# Patient Record
Sex: Male | Born: 1950
Health system: Southern US, Community
[De-identification: ages and names within clinical notes are randomized; demographics above are authoritative.]

## PROBLEM LIST (undated history)

## (undated) DIAGNOSIS — F419 Anxiety disorder, unspecified: Secondary | ICD-10-CM

## (undated) DIAGNOSIS — F329 Major depressive disorder, single episode, unspecified: Secondary | ICD-10-CM

## (undated) DIAGNOSIS — T7840XA Allergy, unspecified, initial encounter: Secondary | ICD-10-CM

## (undated) DIAGNOSIS — I10 Essential (primary) hypertension: Secondary | ICD-10-CM

## (undated) DIAGNOSIS — G20A1 Parkinson's disease without dyskinesia, without mention of fluctuations: Secondary | ICD-10-CM

## (undated) DIAGNOSIS — E785 Hyperlipidemia, unspecified: Secondary | ICD-10-CM

## (undated) DIAGNOSIS — G4730 Sleep apnea, unspecified: Secondary | ICD-10-CM

## (undated) DIAGNOSIS — R251 Tremor, unspecified: Secondary | ICD-10-CM

## (undated) DIAGNOSIS — G20 Parkinson's disease: Secondary | ICD-10-CM

## (undated) DIAGNOSIS — M109 Gout, unspecified: Secondary | ICD-10-CM

## (undated) DIAGNOSIS — K219 Gastro-esophageal reflux disease without esophagitis: Secondary | ICD-10-CM

## (undated) DIAGNOSIS — S0990XA Unspecified injury of head, initial encounter: Secondary | ICD-10-CM

## (undated) DIAGNOSIS — N529 Male erectile dysfunction, unspecified: Secondary | ICD-10-CM

## (undated) DIAGNOSIS — C801 Malignant (primary) neoplasm, unspecified: Secondary | ICD-10-CM

## (undated) DIAGNOSIS — S060X9A Concussion with loss of consciousness of unspecified duration, initial encounter: Secondary | ICD-10-CM

## (undated) DIAGNOSIS — M1990 Unspecified osteoarthritis, unspecified site: Secondary | ICD-10-CM

## (undated) DIAGNOSIS — F32A Depression, unspecified: Secondary | ICD-10-CM

## (undated) DIAGNOSIS — S060XAA Concussion with loss of consciousness status unknown, initial encounter: Secondary | ICD-10-CM

## (undated) DIAGNOSIS — R410 Disorientation, unspecified: Secondary | ICD-10-CM

## (undated) HISTORY — DX: Essential (primary) hypertension: I10

## (undated) HISTORY — PX: ESOPHAGOGASTRODUODENOSCOPY: SHX1529

## (undated) HISTORY — DX: Tremor, unspecified: R25.1

## (undated) HISTORY — DX: Major depressive disorder, single episode, unspecified: F32.9

## (undated) HISTORY — DX: Anxiety disorder, unspecified: F41.9

## (undated) HISTORY — PX: TOTAL HIP ARTHROPLASTY: SHX124

## (undated) HISTORY — DX: Depression, unspecified: F32.A

## (undated) HISTORY — DX: Hyperlipidemia, unspecified: E78.5

## (undated) HISTORY — PX: JOINT REPLACEMENT: SHX530

## (undated) HISTORY — PX: SQUAMOUS CELL CARCINOMA EXCISION: SHX2433

---

## 1995-06-07 HISTORY — PX: OTHER SURGICAL HISTORY: SHX169

## 2012-11-22 ENCOUNTER — Ambulatory Visit: Payer: Self-pay | Admitting: Neurology

## 2012-11-28 ENCOUNTER — Ambulatory Visit: Payer: Self-pay | Admitting: Neurology

## 2013-01-28 ENCOUNTER — Ambulatory Visit (INDEPENDENT_AMBULATORY_CARE_PROVIDER_SITE_OTHER): Payer: BC Managed Care – PPO | Admitting: Neurology

## 2013-01-28 ENCOUNTER — Encounter: Payer: Self-pay | Admitting: Neurology

## 2013-01-28 VITALS — BP 110/62 | HR 72 | Temp 98.0°F | Resp 16 | Wt 268.0 lb

## 2013-01-28 DIAGNOSIS — F4323 Adjustment disorder with mixed anxiety and depressed mood: Secondary | ICD-10-CM

## 2013-01-28 DIAGNOSIS — R259 Unspecified abnormal involuntary movements: Secondary | ICD-10-CM

## 2013-01-28 DIAGNOSIS — R251 Tremor, unspecified: Secondary | ICD-10-CM

## 2013-01-28 LAB — CBC WITH DIFFERENTIAL/PLATELET
Basophils Relative: 0.3 % (ref 0.0–3.0)
Eosinophils Absolute: 0.1 10*3/uL (ref 0.0–0.7)
HCT: 40.5 % (ref 39.0–52.0)
Hemoglobin: 13.8 g/dL (ref 13.0–17.0)
Lymphs Abs: 1.6 10*3/uL (ref 0.7–4.0)
MCHC: 33.9 g/dL (ref 30.0–36.0)
MCV: 91.2 fl (ref 78.0–100.0)
Monocytes Absolute: 0.6 10*3/uL (ref 0.1–1.0)
Neutro Abs: 4.3 10*3/uL (ref 1.4–7.7)
Neutrophils Relative %: 64.9 % (ref 43.0–77.0)
RBC: 4.44 Mil/uL (ref 4.22–5.81)

## 2013-01-28 LAB — COMPREHENSIVE METABOLIC PANEL
AST: 20 U/L (ref 0–37)
Alkaline Phosphatase: 60 U/L (ref 39–117)
BUN: 11 mg/dL (ref 6–23)
Creatinine, Ser: 0.9 mg/dL (ref 0.4–1.5)
Glucose, Bld: 79 mg/dL (ref 70–99)
Total Bilirubin: 0.6 mg/dL (ref 0.3–1.2)

## 2013-01-28 MED ORDER — BENZTROPINE MESYLATE 1 MG PO TABS: 1.0000 mg | ORAL_TABLET | Freq: Every day | ORAL | Status: DC

## 2013-01-28 NOTE — Progress Notes (Signed)
Christian Anderson was seen today in the movement disorders clinic for neurologic consultation at the request of Dr. Malvin Anderson.  His primary care physician is Christian D, MD.  The consultation is for the evaluation of tremor.  The patient is accompanied by his wife who supplements the history.  The pt reports that he developed tremor in the RUE in the mid 1990's that was action based.  It was only in the R hand.  It sounds like it was dx with writers cramp (but occurred when holding cup as well as writing and basically any task that required some degree of focus).  He was placed on nadolol which helped some, and later the nadolol was used for BP as well.  In 08/2010, he began to have tremor in the L hand at rest.  Shortly thereafter, he noted a similar tremor in the R leg.  He was placed on artane and it worked well but caused hallucinations and short term memory changes.  He was then placed on levodopa and it helped but not as much as the artane.  He was in [place] at the time and it was recommended that he see a movement specialist.  He was moving at the time and ended up moving to [facility] and saw Dr. Malvin Anderson.  He was started on klonopin at the end of July, 0.5 mg, and he takes one tablet bid.  He has not noted much difference with the med.  Just very recently he was started on gabapentin.  He does not think that it has been helpful.  He does think that some of the medications cause daytime hypersomnolence.  He also has sleep apnea and just a few weeks ago was placed on CPAP.  The patient's wife does report that stress significantly affects the tremor.  She reports that the patient has been under a significant amount of stress over the last few years.  He has had some trouble at work, lived apart from his family because of work and is just now starting to get some of these issues resolved.  As a result of these issues, she reports that depression became an issue, which is better with medication.  He is not  suicidal or homicidal.  He does not think that caffeine or alcohol affects the tremor (alcohol may slow it down but he is not sure).     Specific Symptoms:  Tremor: yes and goes away at night Voice: ?  Sleep:   Vivid Dreams:  no  Acting out dreams:  no Wet Pillows: no Postural symptoms:  yes  Falls?  no Bradykinesia symptoms: slow getting out of chair b/c b/l hip replacement Loss of smell:  yes x 15 years Loss of taste:  no Urinary Incontinence:  no Difficulty Swallowing:  no Handwriting, micrographia: no Trouble with ADL's:  no  Trouble buttoning clothing: no Depression:  yes (sx's x few years - on antidepressant that helps) Memory changes:  no Hallucinations:  yes (with artane but one other time when not on med, saw Christian Anderson)  visual distortions: no N/V:  no Lightheaded:  yes (dizzy and off balance, no spinning)  Syncope: no Diplopia:  no Dyskinesia:  no   I had the opportunity to review Dr. Geralyn Anderson notes.    When the patient went to Dr. Malvin Anderson, he did not think that the patient had Parkinson's disease and discontinued his Sinemet in May 2014.  The patient's symptoms got worse.  Requip was initiated on 10/18/2012.  This  did not seem to help.  He has been on Artane in the past with significant benefit with the tremor, but it caused hallucinations.  He most recently started on clonazepam.  Neuroimaging has previously been performed.  It is not available for my review today.  The patient reports that he had a normal CT of the brain.  He has refused MRI because of severe claustrophobia.  In the past, a DaT scan was recommended that he also refused because of claustrophobia.  PREVIOUS MEDICATIONS: Sinemet, Requip and artane and klonopin  ALLERGIES:  No Known Allergies  CURRENT MEDICATIONS:    Medication List       This list is accurate as of: 01/28/13  9:52 AM.  Always use your most recent med list.               aspirin 325 MG tablet  Take 325 mg by mouth daily.      bumetanide 0.5 MG tablet  Commonly known as:  BUMEX  daily.     clonazePAM 0.5 MG tablet  Commonly known as:  KLONOPIN  2 (two) times daily as needed.     cyclobenzaprine 10 MG tablet  Commonly known as:  FLEXERIL     gabapentin 300 MG capsule  Commonly known as:  NEURONTIN  300 mg 3 (three) times daily.     lisinopril 20 MG tablet  Commonly known as:  PRINIVIL,ZESTRIL  Take 20 mg by mouth daily.     nadolol 80 MG tablet  Commonly known as:  CORGARD  1 1/2 bid     rOPINIRole 0.5 MG tablet  Commonly known as:  REQUIP  3 (three) times daily.     simvastatin 40 MG tablet  Commonly known as:  ZOCOR  at bedtime.          PAST MEDICAL HISTORY:   Past Medical History  Diagnosis Date  . Tremor   . Hypertension   . Hyperlipemia     PAST SURGICAL HISTORY:   Past Surgical History  Procedure Laterality Date  . Total hip arthroplasty      bilateral  . Veins stripping    . Mole removal      SOCIAL HISTORY:   History   Social History  . Marital Status: Married    Spouse Name: N/A    Number of Children: N/A  . Years of Education: N/A   Occupational History  . Not on file.   Social History Main Topics  . Smoking status: Former Smoker    Start date: 01/29/1988  . Smokeless tobacco: Never Used     Comment: occasional cigar not had any in 3-4 years.  . Alcohol Use: Yes     Comment: 2-3 beers nightly, occasional wine.  . Drug Use: Not on file  . Sexual Activity: Not on file   Other Topics Concern  . Not on file   Social History Narrative  . No narrative on file    FAMILY HISTORY:   Family Status  Relation Status Death Age  . Father Deceased     60  . Mother Deceased     56  . Sister Alive   . Sister Alive   . Sister Alive     ROS:  A complete 10 system review of systems was obtained and was unremarkable apart from what is mentioned above.  PHYSICAL EXAMINATION:    VITALS:   Filed Vitals:   01/28/13 0903  BP: 110/62  Pulse: 72   Temp:  98 F (36.7 C)  Resp: 16  Weight: 268 lb (121.564 kg)    GEN:  The patient appears stated age and is in NAD. HEENT:  Normocephalic, atraumatic.  The mucous membranes are moist. The superficial temporal arteries are without ropiness or tenderness. CV:  RRR Lungs:  CTAB Neck/HEME:  There are no carotid bruits bilaterally.  Neurological examination:  Orientation: The patient is alert and oriented x3. Fund of knowledge is appropriate.  Recent and remote memory are intact.  Attention and concentration are normal.    Able to name objects and repeat phrases. Cranial nerves: There is good facial symmetry. No hypomimia.  Pupils are equal round and reactive to light bilaterally. Fundoscopic exam reveals clear margins bilaterally. Extraocular muscles are intact. The visual fields are full to confrontational testing. The speech is fluent and clear. Soft palate rises symmetrically and there is no tongue deviation. Hearing is intact to conversational tone. Sensation: Sensation is intact to light and pinprick throughout (facial, trunk, extremities). Vibration is intact at the bilateral big toe. There is no extinction with double simultaneous stimulation. There is no sensory dermatomal level identified. Motor: Strength is 5/5 in the bilateral upper and lower extremities.   Shoulder shrug is equal and symmetric.  There is no pronator drift. Deep tendon reflexes: Deep tendon reflexes are 2/4 at the bilateral biceps, triceps, brachioradialis, patella and achilles. Plantar responses are downgoing bilaterally.  Movement examination: Tone: There is normal tone in the bilateral upper extremities.  The tone in the lower extremities is normal.  Abnormal movements: There is an intermittent, severe, flapping tremor of the left upper extremity.  When asked to count backward, the tremor diminishes but does not go away.  When asked to name the months in reverse order, the tremor diminishes but does not go away.   When he holds his hand up near his shoulder (had L hand on right shoulder while talking with me for a while) the tremor is gone.  There is no tremor antigravity, even when asked to hold the hand in the same posture.  There is no tremor with intention.  There is intermittent right lower extremity tremor. Coordination:  There is no decremation with RAM's, including with all forms of rapid alternating movements, including hand opening and closing, finger taps, alternating supination/pronation of the forearm, heel taps and toe taps. Gait and Station: The patient has no difficulty arising out of a deep-seated chair without the use of the hands. The patient's stride length is normal.  The left upper extremity tremor continues with ambulation.  ASSESSMENT/PLAN:  1.  Tremor.  -I had discussion with the patient and his wife.  I see no parkinsonian features.  He is not bradykinetic, does not have a shuffling gait and has no rigidity.  He has a significant high amplitude tremor that is somewhat variable during the examination.  I agree that psychogenic etiology is in the differential diagnoses.  Asymmetric essential tremor cannot be ruled out, but I think that is much lower on the differential, as he had nothing with intention or antigravity.  In addition, when Dr. Geralyn Anderson notes are reviewed, the course has been pretty rapid and changing.  I agree with Dr. Malvin Anderson that it would be nice to see an MRI of the brain.  The patient is very reluctant.  He cannot do an open MRI even with sedation.  I talked to him about the MRI at [facility] (closed but pts can sit up and watch TV) and he  will consider this.  He is not willing to undergo DaT scan.  -We decided to try benztropine at bedtime.  The dosage may need to be increased if it is helpful, but I did not want to cause hallucinations as he had with the trihexyphenidyl.  I did warn him that this could be a side effect.  We talked about the other potential  anticholinergic side effects, amongst others.  He understood.  Risks, benefits, side effects and alternative therapies were discussed.  The opportunity to ask questions was given and they were answered to the best of my ability.  The patient expressed understanding and willingness to follow the outlined treatment protocols.  -I talked to him about the importance of getting his stress and her better control.  Even if stress is not the primary etiology of the tremor, he and his wife admit that he has been under a significant amount of anxiety and stress and we talked about stress reduction techniques.  His wife would like him to try acupuncture, which I have no objection to.  If these things are not helpful, then perhaps sending him to a psychiatrist would be of value.  -The pt is going to continue to follow with Dr. Malvin Anderson and they already have an appointment scheduled in the future.  I would be happy to see him in the future if needed.  Thank you, Ian Malkin, for allowing me to participate in the care of your patient.  Please feel free to contact me if you have any questions or concerns.

## 2013-01-28 NOTE — Patient Instructions (Addendum)
1.  Exercise 2.  Try cogentin (benztropine) - 1 mg - 1 tablet at bedtime 3.  I will send Dr. Malvin Johns my notes and recommendations

## 2013-01-29 LAB — TSH: TSH: 0.99 u[IU]/mL (ref 0.35–5.50)

## 2013-01-29 LAB — FOLATE: Folate: 5.7 ng/mL — ABNORMAL LOW (ref >5.9)

## 2013-01-29 LAB — VITAMIN B12: Vitamin B-12: 307 pg/mL (ref 211–911)

## 2013-04-30 ENCOUNTER — Observation Stay: Payer: Self-pay | Admitting: Internal Medicine

## 2013-04-30 LAB — CBC WITH DIFFERENTIAL/PLATELET
Basophil %: 0.3 %
Eosinophil #: 0.1 10*3/uL (ref 0.0–0.7)
HGB: 14.1 g/dL (ref 13.0–18.0)
MCH: 30.9 pg (ref 26.0–34.0)
MCV: 91 fL (ref 80–100)
RBC: 4.56 10*6/uL (ref 4.40–5.90)
RDW: 13.1 % (ref 11.5–14.5)

## 2013-04-30 LAB — COMPREHENSIVE METABOLIC PANEL
Alkaline Phosphatase: 81 U/L
Anion Gap: 6 — ABNORMAL LOW (ref 7–16)
Bilirubin,Total: 0.5 mg/dL (ref 0.2–1.0)
Chloride: 97 mmol/L — ABNORMAL LOW (ref 98–107)
EGFR (Non-African Amer.): >60
Glucose: 132 mg/dL — ABNORMAL HIGH (ref 65–99)
Osmolality: 258 (ref 275–301)
Total Protein: 7.2 g/dL (ref 6.4–8.2)

## 2013-04-30 LAB — LIPASE, BLOOD: Lipase: 116 U/L (ref 73–393)

## 2013-05-01 LAB — URINALYSIS, COMPLETE
Bacteria: NONE SEEN
Bilirubin,UR: NEGATIVE
Glucose,UR: NEGATIVE mg/dL (ref 0–75)
Leukocyte Esterase: NEGATIVE
Protein: NEGATIVE
RBC,UR: 1 /HPF (ref 0–5)
Specific Gravity: 1.013 (ref 1.003–1.030)
WBC UR: <1 /HPF (ref 0–5)

## 2013-05-01 LAB — LIPID PANEL
Triglycerides: 102 mg/dL (ref 0–200)
VLDL Cholesterol, Calc: 20 mg/dL (ref 5–40)

## 2013-05-01 LAB — SODIUM: Sodium: 132 mmol/L — ABNORMAL LOW (ref 136–145)

## 2013-06-24 ENCOUNTER — Ambulatory Visit: Payer: Self-pay | Admitting: Internal Medicine

## 2013-07-02 ENCOUNTER — Telehealth: Payer: Self-pay | Admitting: *Deleted

## 2013-07-02 ENCOUNTER — Telehealth: Payer: Self-pay | Admitting: Neurology

## 2013-07-02 NOTE — Telephone Encounter (Signed)
Dr. Melrose Nakayama is his neurologist then and will need to come from him.

## 2013-07-02 NOTE — Telephone Encounter (Signed)
Pt's spouse called requesting a refill for benztropine.

## 2013-07-02 NOTE — Telephone Encounter (Signed)
Requesting a refill for benztropine  You prescribed medication 8/14 #30 with 3 refills  Patient has no follow up and is being followed by Dr.Potter .Please advise

## 2013-07-02 NOTE — Telephone Encounter (Signed)
Patient requested a refill on Benztropine Dr Melrose Nakayama is the physician who is prescribing this medication for him patient states he will call Dr Melrose Nakayama for this refill.

## 2013-07-04 ENCOUNTER — Ambulatory Visit: Payer: Self-pay | Admitting: Gastroenterology

## 2013-07-04 HISTORY — PX: COLONOSCOPY: SHX174

## 2013-07-06 LAB — PATHOLOGY REPORT

## 2013-09-13 DIAGNOSIS — G479 Sleep disorder, unspecified: Secondary | ICD-10-CM | POA: Insufficient documentation

## 2013-09-13 DIAGNOSIS — F419 Anxiety disorder, unspecified: Secondary | ICD-10-CM | POA: Insufficient documentation

## 2013-09-13 DIAGNOSIS — F329 Major depressive disorder, single episode, unspecified: Secondary | ICD-10-CM | POA: Insufficient documentation

## 2013-09-13 DIAGNOSIS — F32A Depression, unspecified: Secondary | ICD-10-CM | POA: Insufficient documentation

## 2013-10-07 ENCOUNTER — Inpatient Hospital Stay: Payer: Self-pay | Admitting: Internal Medicine

## 2013-10-07 LAB — URINALYSIS, COMPLETE
BACTERIA: NONE SEEN
BILIRUBIN, UR: NEGATIVE
BLOOD: NEGATIVE
Glucose,UR: NEGATIVE mg/dL (ref 0–75)
KETONE: NEGATIVE
LEUKOCYTE ESTERASE: NEGATIVE
Nitrite: NEGATIVE
PH: 7 (ref 4.5–8.0)
Protein: NEGATIVE
RBC, UR: NONE SEEN /HPF (ref 0–5)
SPECIFIC GRAVITY: 1.012 (ref 1.003–1.030)
WBC UR: NONE SEEN /HPF (ref 0–5)

## 2013-10-07 LAB — COMPREHENSIVE METABOLIC PANEL
AST: 20 U/L (ref 15–37)
Albumin: 3.2 g/dL — ABNORMAL LOW (ref 3.4–5.0)
Alkaline Phosphatase: 82 U/L
Anion Gap: 3 — ABNORMAL LOW (ref 7–16)
BUN: 11 mg/dL (ref 7–18)
Bilirubin,Total: 0.5 mg/dL (ref 0.2–1.0)
CHLORIDE: 103 mmol/L (ref 98–107)
Calcium, Total: 9.1 mg/dL (ref 8.5–10.1)
Co2: 30 mmol/L (ref 21–32)
Creatinine: 0.93 mg/dL (ref 0.60–1.30)
EGFR (African American): >60
Glucose: 132 mg/dL — ABNORMAL HIGH (ref 65–99)
Osmolality: 273 (ref 275–301)
POTASSIUM: 4.6 mmol/L (ref 3.5–5.1)
SGPT (ALT): 28 U/L (ref 12–78)
Sodium: 136 mmol/L (ref 136–145)
TOTAL PROTEIN: 6.8 g/dL (ref 6.4–8.2)

## 2013-10-07 LAB — DRUG SCREEN, URINE

## 2013-10-07 LAB — CBC
HCT: 40.3 % (ref 40.0–52.0)
HGB: 13.5 g/dL (ref 13.0–18.0)
MCH: 30.9 pg (ref 26.0–34.0)
MCHC: 33.6 g/dL (ref 32.0–36.0)
MCV: 92 fL (ref 80–100)
Platelet: 201 10*3/uL (ref 150–440)
RBC: 4.38 10*6/uL — ABNORMAL LOW (ref 4.40–5.90)
RDW: 13.4 % (ref 11.5–14.5)
WBC: 6 10*3/uL (ref 3.8–10.6)

## 2013-10-07 LAB — TROPONIN I

## 2013-10-08 ENCOUNTER — Ambulatory Visit: Payer: Self-pay | Admitting: Neurology

## 2013-10-08 LAB — TSH: THYROID STIMULATING HORM: 0.987 u[IU]/mL

## 2013-10-09 LAB — COMPREHENSIVE METABOLIC PANEL
Albumin: 3.4 g/dL (ref 3.4–5.0)
Alkaline Phosphatase: 81 U/L
Anion Gap: 3 — ABNORMAL LOW (ref 7–16)
BUN: 10 mg/dL (ref 7–18)
Bilirubin,Total: 0.6 mg/dL (ref 0.2–1.0)
CHLORIDE: 107 mmol/L (ref 98–107)
CREATININE: 0.9 mg/dL (ref 0.60–1.30)
Calcium, Total: 9 mg/dL (ref 8.5–10.1)
Co2: 30 mmol/L (ref 21–32)
EGFR (African American): >60
EGFR (Non-African Amer.): >60
Glucose: 94 mg/dL (ref 65–99)
Osmolality: 278 (ref 275–301)
POTASSIUM: 3.9 mmol/L (ref 3.5–5.1)
SGOT(AST): 27 U/L (ref 15–37)
SGPT (ALT): 27 U/L (ref 12–78)
SODIUM: 140 mmol/L (ref 136–145)
Total Protein: 7.3 g/dL (ref 6.4–8.2)

## 2013-10-09 LAB — CBC WITH DIFFERENTIAL/PLATELET
BASOS ABS: 0 10*3/uL (ref 0.0–0.1)
Basophil %: 0.2 %
Eosinophil #: 0.1 10*3/uL (ref 0.0–0.7)
Eosinophil %: 1.1 %
HCT: 43.3 % (ref 40.0–52.0)
HGB: 14.5 g/dL (ref 13.0–18.0)
LYMPHS ABS: 1.5 10*3/uL (ref 1.0–3.6)
LYMPHS PCT: 24.8 %
MCH: 30.9 pg (ref 26.0–34.0)
MCHC: 33.6 g/dL (ref 32.0–36.0)
MCV: 92 fL (ref 80–100)
MONO ABS: 0.6 x10 3/mm (ref 0.2–1.0)
Monocyte %: 9.6 %
NEUTROS PCT: 64.3 %
Neutrophil #: 3.8 10*3/uL (ref 1.4–6.5)
Platelet: 203 10*3/uL (ref 150–440)
RBC: 4.71 10*6/uL (ref 4.40–5.90)
RDW: 13.1 % (ref 11.5–14.5)
WBC: 6 10*3/uL (ref 3.8–10.6)

## 2013-10-11 LAB — CSF CELL CT + PROT + GLU PANEL
CSF TUBE #: 2
Eosinophil: 0 %
Glucose, CSF: 60 mg/dL (ref 40–75)
LYMPHS PCT: 0 %
Monocytes/Macrophages: 0 %
NEUTROS PCT: 0 %
OTHER CELLS: 0 %
Protein, CSF: 49 mg/dL — ABNORMAL HIGH (ref 15–45)
RBC (CSF): 114 /mm3
WBC (CSF): 0 /mm3

## 2013-10-11 LAB — PROTIME-INR
INR: 1.1
Prothrombin Time: 13.9 secs (ref 11.5–14.7)

## 2013-10-11 LAB — APTT: ACTIVATED PTT: 29.7 s (ref 23.6–35.9)

## 2013-10-12 LAB — CBC WITH DIFFERENTIAL/PLATELET
Basophil #: 0 10*3/uL (ref 0.0–0.1)
Basophil %: 0.2 %
EOS PCT: 1.3 %
Eosinophil #: 0.1 10*3/uL (ref 0.0–0.7)
HCT: 48.3 % (ref 40.0–52.0)
HGB: 15.4 g/dL (ref 13.0–18.0)
Lymphocyte #: 1.7 10*3/uL (ref 1.0–3.6)
Lymphocyte %: 22 %
MCH: 29.5 pg (ref 26.0–34.0)
MCHC: 31.9 g/dL — AB (ref 32.0–36.0)
MCV: 93 fL (ref 80–100)
MONO ABS: 0.8 x10 3/mm (ref 0.2–1.0)
MONOS PCT: 10.3 %
NEUTROS PCT: 66.2 %
Neutrophil #: 5.1 10*3/uL (ref 1.4–6.5)
Platelet: 243 10*3/uL (ref 150–440)
RBC: 5.22 10*6/uL (ref 4.40–5.90)
RDW: 13.2 % (ref 11.5–14.5)
WBC: 7.8 10*3/uL (ref 3.8–10.6)

## 2013-10-12 LAB — COMPREHENSIVE METABOLIC PANEL
ALBUMIN: 3.3 g/dL — AB (ref 3.4–5.0)
AST: 33 U/L (ref 15–37)
Alkaline Phosphatase: 83 U/L
Anion Gap: 7 (ref 7–16)
BUN: 15 mg/dL (ref 7–18)
Bilirubin,Total: 0.7 mg/dL (ref 0.2–1.0)
Calcium, Total: 8.9 mg/dL (ref 8.5–10.1)
Chloride: 106 mmol/L (ref 98–107)
Co2: 27 mmol/L (ref 21–32)
Creatinine: 1.03 mg/dL (ref 0.60–1.30)
EGFR (African American): >60
EGFR (Non-African Amer.): >60
GLUCOSE: 100 mg/dL — AB (ref 65–99)
OSMOLALITY: 280 (ref 275–301)
Potassium: 3.9 mmol/L (ref 3.5–5.1)
SGPT (ALT): 33 U/L (ref 12–78)
Sodium: 140 mmol/L (ref 136–145)
Total Protein: 7.4 g/dL (ref 6.4–8.2)

## 2013-10-12 LAB — AMMONIA: Ammonia, Plasma: <10 mcmol/L (ref 11–32)

## 2013-10-17 DIAGNOSIS — F4489 Other dissociative and conversion disorders: Secondary | ICD-10-CM | POA: Insufficient documentation

## 2013-10-17 DIAGNOSIS — F449 Dissociative and conversion disorder, unspecified: Secondary | ICD-10-CM | POA: Insufficient documentation

## 2013-12-23 DIAGNOSIS — R609 Edema, unspecified: Secondary | ICD-10-CM | POA: Insufficient documentation

## 2013-12-23 DIAGNOSIS — I10 Essential (primary) hypertension: Secondary | ICD-10-CM | POA: Insufficient documentation

## 2014-01-21 DIAGNOSIS — G20 Parkinson's disease: Secondary | ICD-10-CM | POA: Insufficient documentation

## 2014-01-21 DIAGNOSIS — E669 Obesity, unspecified: Secondary | ICD-10-CM | POA: Insufficient documentation

## 2014-01-21 DIAGNOSIS — G20A1 Parkinson's disease without dyskinesia, without mention of fluctuations: Secondary | ICD-10-CM | POA: Insufficient documentation

## 2014-02-06 DIAGNOSIS — Z9660 Presence of unspecified orthopedic joint implant: Secondary | ICD-10-CM | POA: Insufficient documentation

## 2014-02-06 DIAGNOSIS — Z96649 Presence of unspecified artificial hip joint: Secondary | ICD-10-CM | POA: Insufficient documentation

## 2014-02-06 DIAGNOSIS — M25559 Pain in unspecified hip: Secondary | ICD-10-CM | POA: Insufficient documentation

## 2014-06-11 DIAGNOSIS — E7800 Pure hypercholesterolemia, unspecified: Secondary | ICD-10-CM | POA: Insufficient documentation

## 2014-09-26 NOTE — H&P (Signed)
ADDENDUM  PATIENT NAME:  SAVOY, SOMERVILLE MR#:  [id number] DATE OF BIRTH:  1951/04/03  DATE OF ADMISSION:  04/30/2013  HOME MEDICATIONS: Include Cialis taken as needed, aspirin 325 mg p.o. daily, lisinopril 20 mg p.o. b.i.d., gabapentin 300 mg p.o. t.i.d., citalopram 20 mg p.o. daily, Benztropine 1 mg p.o. at bedtime, Ropinirole 0.5 mg p.o. 3 times daily, nadolol 80 mg 1-1/2 tablets p.o. b.i.d., Bumex 0.5 mg tablet p.o. daily and Flexeril 10 mg p.o. at bedtime.   ____________________________ Aaron Mose. Hower, MD dkh:aw D: 05/01/2013 03:13:02 ET T: 05/01/2013 03:35:51 ET JOB#: [id number]  cc: Aaron Mose. Hower, MD, <Dictator> DAVID Woodfin Ganja MD ELECTRONICALLY SIGNED 05/08/2013 20:07

## 2014-09-26 NOTE — Consult Note (Signed)
Referring Physician:  Lytle Butte   Primary Care Physician:   [facility], [address] [place], [place] [address], [place] [phone]  Reason for Consult: Admit Date: 30-Apr-2013  Chief Complaint: Dizziness  Reason for Consult: TIA   History of Present Illness: History of Present Illness:   64 year old man with probable psychogenic movement disorder presents with abrupt onset dizziness, nausea and headaches.  Headaches felt like throbbing and were mild.  They occurred occasionally and were not constant.  Nothing mad ethe symptoms better or worse.  Felt a sense of vertigo like the room was moving.  No sensitivity to light, aura, visual disturbance.  Symptoms have progrssively improved since admission.  Still having some tremors, but no change recently.  Per patient's wife, she says he was doing much better overall since his visit with myself in clinic, but then 2-3 weeks ago his symptoms started to come back.  However, with regards to current hospitaliation, no more episodes, uncertain etiology for the initial episode.  Initially thought to maybe be a TIA.  Had HCT that was unremarkable. MEDICAL AND SURGICAL HISTORIES: disorder, possibly psychogenic.replacement surgery. HISTORY: alcohol, drugs or tobacco. HISTORY: Diabetes MEDS:    ROS:  General denies complaints   HEENT no complaints   Lungs no complaints   Cardiac no complaints   GI no complaints   GU no complaints   Musculoskeletal no complaints   Extremities no complaints   Skin no complaints   Neuro tremors    Endocrine no complaints    Psych no complaints    Past Medical/Surgical Hx:  varicose vein removal from left leg:   bilateral hip replacement:   Home Medications: Medication Instructions Last Modified Date/Time  nadolol 80 mg oral tablet 1.5 tab(s) orally 2 times a day 25-Nov-14 22:28  rOPINIRole 0.5 mg oral tablet 1 tab(s) orally 3 times a day 25-Nov-14 22:28   gabapentin 300 mg oral capsule 1 cap(s) orally 3 times a day 25-Nov-14 22:28  bumetanide 0.5 mg oral tablet 1 tab(s) orally once a day 25-Nov-14 22:28  citalopram 20 mg oral tablet 1 tab(s) orally once a day 25-Nov-14 22:28  cyclobenzaprine 10 mg oral tablet 1 tab(s) orally once a day (at bedtime) 25-Nov-14 22:28  benztropine 1 mg oral tablet 1 tab(s) orally once a day (at bedtime) 25-Nov-14 22:28  lisinopril 20 mg oral tablet 1 tab(s) orally 2 times a day 25-Nov-14 22:28  aspirin 325 mg oral tablet 1 tab(s) orally once a day 25-Nov-14 22:28  Cialis  orally  25-Nov-14 22:28   Allergies:  No Known Allergies:   Vital Signs: **Vital Signs.:   26-Nov-14 08:03  Vital Signs Type Pre Medication  Temperature Temperature (F) 97.8  Celsius 36.5  Temperature Source oral  Pulse Pulse 64  Respirations Respirations 18  Systolic BP Systolic BP 536  Diastolic BP (mmHg) Diastolic BP (mmHg) 86  Mean BP 110  Pulse Ox % Pulse Ox % 99  Pulse Ox Activity Level  At rest  Oxygen Delivery Room Air/ 21 %   EXAM: GENERAL: Pleasant man, in NAD.  Normocephalic and atraumatic.  EYES: Funduscopic exam shows normal disc size, appearance and C/D ratio without clear evidence of papilledema.  CARDIOVASCULAR: S1 and S2 sounds are within normal limits, without murmurs, gallops, or rubs.  MUSCULOSKELETAL: Tremor - mild, bilateral hands, appears physiolgoic vs psychogenic, starting and stopping nature supports psychogenic. Bulk - Obese Tone - Normal Pronator Drift - Absent bilaterally. Ambulation -  Gait and station are within normal limits. Romberg - Absent  R/L 5/5    Shoulder abduction (deltoid/supraspinatus, axillary/suprascapular n, C5) 5/5    Elbow flexion (biceps brachii, musculoskeletal n, C5-6) 5/5    Elbow extension (triceps, radial n, C7) 5/5    Finger adduction (interossei, ulnar n, T1)   5/5    Hip flexion (iliopsoas, L1/L2) 5/5    Knee flexion (hamstrings, sciatic n, L5/S1) 5/5    Knee  extension (quadriceps, femoral n, L3/4) 5/5    Ankle dorsiflexion (tibialis anterior, deep fibular n, L4/5) 5/5    Ankle plantarflexion (gastroc, tibial n, S1)  NEUROLOGICAL: MENTAL STATUS: Patient is oriented to person, place and time.  Recent and remote memory are intact.  Attention span and concentration are intact.  Naming, repetition, comprehension and expressive speech are within normal limits.  Patient's fund of knowledge is within normal limits for educational level.  CRANIAL NERVES: Normal    CN II (normal visual acuity and visual fields) Normal    CN III, IV, VI (extraocular muscles are intact) Normal    CN V (facial sensation is intact bilaterally) Normal    CN VII (facial strength is intact bilaterally) Normal    CN VIII (hearing is intact bilaterally) Normal    CN IX/X (palate elevates midline, normal phonation) Normal    CN XI (shoulder shrug strength is normal and symmetric) Normal    CN XII (tongue protrudes midline)   SENSATION: Intact to pain and temp bilaterally (spinothalamic tracts) Intact to position and vibration bilaterally (dorsal columns)   REFLEXES: R/L 2+/2+    Biceps 2+/2+    Brachioradialis   2+/2+    Patellar 2+/2+    Achilles   COORDINATION/CEREBELLAR: Finger to nose testing is within normal limits..  Lab Results:  Hepatic:  25-Nov-14 18:46   Bilirubin, Total 0.5  Alkaline Phosphatase 81 (45-117 NOTE: New Reference Range 04/26/13)  SGPT (ALT) 26  SGOT (AST) 23  Total Protein, Serum 7.2  Albumin, Serum 3.6  Cardiology:  25-Nov-14 18:42   Ventricular Rate 54  Atrial Rate 54  P-R Interval 176  QRS Duration 98  QT 456  QTc 432  P Axis 23  R Axis 51  T Axis 11  ECG interpretation Sinus bradycardia Otherwise normal ECG No previous ECGs available ----------unconfirmed---------- Confirmed by OVERREAD, NOT ([id number]), editor PEARSON, BARBARA ([id number]) on 05/01/2013 10:20:45 AM  Routine Chem:  25-Nov-14 18:46   Glucose, Serum  132  BUN 11   Creatinine (comp) 0.85  Potassium, Serum 4.2  Chloride, Serum  97  CO2, Serum 25  Calcium (Total), Serum  8.4  Osmolality (calc) 258  eGFR (African American) >60  eGFR (Non-African American) >60 (eGFR values <74m/min/1.73 m2 may be an indication of chronic kidney disease (CKD). Calculated eGFR is useful in patients with stable renal function. The eGFR calculation will not be reliable in acutely ill patients when serum creatinine is changing rapidly. It is not useful in  patients on dialysis. The eGFR calculation may not be applicable to patients at the low and high extremes of body sizes, pregnant women, and vegetarians.)  Anion Gap  6  Lipase 116 (Result(s) reported on 30 Apr 2013 at 07:34PM.)  26-Nov-14 04:24   Sodium, Serum  132 (Result(s) reported on 01 May 2013 at 07:55AM.)  Cholesterol, Serum 145  Triglycerides, Serum 102  HDL (INHOUSE) 47  VLDL Cholesterol Calculated 20  LDL Cholesterol Calculated 78 (Result(s) reported on 01 May 2013 at 05:03AM.)  Cardiac:  25-Nov-14  18:46   Troponin I < 0.02 (0.00-0.05 0.05 ng/mL or less: NEGATIVE  Repeat testing in 3-6 hrs  if clinically indicated. >0.05 ng/mL: POTENTIAL  MYOCARDIAL INJURY. Repeat  testing in 3-6 hrs if  clinically indicated. NOTE: An increase or decrease  of 30% or more on serial  testing suggests a  clinically important change)  Routine UA:  26-Nov-14 22:15   Color (UA) Yellow  Clarity (UA) Clear  Glucose (UA) Negative  Bilirubin (UA) Negative  Ketones (UA) 1+  Specific Gravity (UA) 1.013  Blood (UA) Negative  pH (UA) 7.0  Protein (UA) Negative  Nitrite (UA) Negative  Leukocyte Esterase (UA) Negative (Result(s) reported on 01 May 2013 at 12:17AM.)  RBC (UA) 1 /HPF  WBC (UA) <1 /HPF  Bacteria (UA) NONE SEEN  Epithelial Cells (UA) NONE SEEN  Result(s) reported on 01 May 2013 at 12:17AM.  Routine Hem:  25-Nov-14 18:46   WBC (CBC) 9.9  RBC (CBC) 4.56  Hemoglobin (CBC) 14.1  Hematocrit (CBC) 41.4   Platelet Count (CBC) 197  MCV 91  MCH 30.9  MCHC 34.1  RDW 13.1  Neutrophil % 76.1  Lymphocyte % 15.2  Monocyte % 7.2  Eosinophil % 1.2  Basophil % 0.3  Neutrophil #  7.6  Lymphocyte # 1.5  Monocyte # 0.7  Eosinophil # 0.1  Basophil # 0.0 (Result(s) reported on 30 Apr 2013 at [facility].)   Radiology Results:  MRI:    26-Nov-14 09:17, MRI Brain Without Contrast  MRI Brain Without Contrast   REASON FOR EXAM:    CVA  COMMENTS:       PROCEDURE: MR  - MR BRAIN WO CONTRAST  - May 01 2013  9:17AM     CLINICAL DATA:  64 year old male with severe headache, nausea,  dizziness, tremors. Initial encounter.    EXAM:  MRI HEAD WITHOUT CONTRAST    TECHNIQUE:  Multiplanar, multiecho pulse sequences of the brain and surrounding  structures were obtained without intravenous contrast.  COMPARISON:  Head CT 04/30/2013.    FINDINGS:  Cerebral volume is within normal limits for age. No restricted  diffusion to suggest acute infarction. No midline shift, mass  effect, evidence of mass lesion, ventriculomegaly, extra-axial  collection or acute intracranial hemorrhage. Cervicomedullary  junction and pituitary are within normal limits. Negative visualized  cervical spine. Major intracranial vascular flow voids are  preserved.    Scattered mild to moderate for age cerebral white matter T2 and  FLAIR hyperintensity in a nonspecific configuration. No cortical  encephalomalacia. Deep gray matter nuclei,brainstem and cerebellum  are within normal limits.  Visualized internal auditory structures appear normal. Mastoids are  clear.    Visualized orbit soft tissues are within normal limits. Paranasal  sinuses are clear. Negative scalp soft tissues. Normal bone marrow  signal.     IMPRESSION:  1.  No acute intracranial abnormality.  2. Mild to moderate for age nonspecific cerebral white matter signal  changes, most commonly due to chronic small vessel disease.      Electronically Signed     By: Lars Pinks M.D.    On: 05/01/2013 10:08         Verified By: Gwenyth Bender. HALL, M.D.,  CT:    25-Nov-14 19:42, CT Head Without Contrast  CT Head Without Contrast   REASON FOR EXAM:    sudden vertigo + vomiting, diminished   coordination/balance.  COMMENTS:       PROCEDURE: CT  - CT HEAD WITHOUT CONTRAST  - Apr 30 2013  7:42PM     CLINICAL DATA:  Acute onset nausea vomiting and dizziness    EXAM:  CT HEAD WITHOUT CONTRAST    TECHNIQUE:  Contiguous axial images were obtained from the base of the skull  through the vertex without intravenous contrast.  COMPARISON:  None.    FINDINGS:  Ventricle size is normal.  Cerebral volume is normal.    Negative for acute infarct, hemorrhage, or mass lesion. Small  hypodensity right frontal white matter consistent with chronic  microvascular ischemia. Mild atherosclerotic disease. No bony  abnormality detected.     IMPRESSION:  No acute abnormality      Electronically Signed    By: Franchot Gallo M.D.    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  etiology for symptoms.  Evaluated for TIA but I think more likely part of his psychogenic syndrome.  REc he follow me closely in clinic since patients often do better with close surveillance and physician encouragement.  He did much better for weeks after his last visit with me though over past couple weeks per wife has relapsed.  Encouraged him about this event today.  No need to change meds.  Does not need to be on sinemet.  Personally viewed Brain MRI and is wnl.  No indication for carotid doppler as TIA unlikely given never any focal neurologic deficits.  Sodium slightly decreased for unknown reasons, could represent psychogenic polydipsia, though evidence for this was not found in the history.  EKG  is unremarkable, shows sinus bradycardia.  Exam is nonfocal.  No additional workup needed at this time.   have reviewed the results of the most recent imaging studies, tests and labs as outlined above and answered all related questions. have personally viewed the patient's Brain MRI and it is essentially within normal limits. and coordinated plan of care with hospitalist.   Electronic Signatures: Anabel Bene (MD)  (Signed [phone] 23:56)  Authored: REFERRING PHYSICIAN, Primary Care Physician, Consult, History of Present Illness, Review of Systems, PAST MEDICAL/SURGICAL HISTORY, HOME MEDICATIONS, ALLERGIES, NURSING VITAL SIGNS, Physical Exam-, LAB RESULTS, RADIOLOGY RESULTS, Recommendations   Last Updated: 26-Nov-14 23:56 by Anabel Bene (MD)

## 2014-09-26 NOTE — H&P (Signed)
PATIENT NAME:  Christian Anderson, Christian Anderson MR#:  [id number] DATE OF BIRTH:  August 18, 1950  DATE OF ADMISSION:  04/30/2013  REFERRING PHYSICIAN: Dr. Joni Fears.   PRIMARY CARE PHYSICIAN: Nonlocal.   NEUROLOGIST: He also follows with neurologist, Dr. Melrose Nakayama.   CHIEF COMPLAINT: Dizziness.   HISTORY OF PRESENT ILLNESS: This is a 64 year old Caucasian gentleman with history of Parkinson's-like disorder who is followed by Dr. Melrose Nakayama of neurology. He is presenting with acute onset of nausea, vomiting and dizziness. He describes having a headache which is global, lasting for the last 24 hours, though intermittent, 2 to 3 out of 10 in intensity, throbbing in nature and nonradiating. He found no worsening or relieving factors. Had no visual changes. No photophobia. He then on the day of admission had acute onset of dizziness which he described as room spinning sensation while he was at work. This was followed by abrupt onset of nausea and vomiting of nonbloody, nonbilious fluid. He then presented to [facility] for further workup and evaluation within approximately 2-1/2 hours from the onset of symptoms. Initial workup revealed CT head negative for evidence of stroke. Currently, he is without complaint.   REVIEW OF SYSTEMS:   CONSTITUTIONAL: Denies fever, fatigue, weakness, pain.  EYES: Denies blurred vision, double vision, eye pain.  EARS, NOSE, THROAT: Denies tinnitus, ear pain, hearing loss.  RESPIRATORY: Denies cough, wheeze, shortness of breath.  CARDIOVASCULAR: Denies chest pain, palpitations, edema.  GASTROINTESTINAL: Positive for nausea and vomiting as described above. Denies diarrhea or abdominal pain.  GENITOURINARY: Denies dysuria, hematuria.  ENDOCRINE: Denies nocturia or polyuria.  HEMATOLOGIC AND LYMPHATIC: Denies easy bruising or bleeding.  SKIN: Denies any rash or lesions.  MUSCULOSKELETAL: Denies any pain in neck, back, shoulder, knees, hips, any arthritic symptoms.  NEUROLOGIC: Denies any  paralysis or paresthesias. Positive for headache as described above. Positive for tremor which is chronic, though somewhat worsened now.  PSYCHIATRIC: Denies any anxiety or depressive symptoms.   Otherwise, full review of systems performed by me is negative.   PAST MEDICAL HISTORY: Parkinson's-like movement disorder, followed by Dr. Melrose Nakayama of neurology, as well as bilateral hip replacements.   ALLERGIES: No known drug allergies.   HOME MEDICATIONS: No medications.   SOCIAL HISTORY: Denies alcohol, tobacco or drug usage.   FAMILY HISTORY: Positive for diabetes and coronary artery disease.   PHYSICAL EXAMINATION:  VITAL SIGNS: Temperature 97.6, heart rate 53, respirations 18, blood pressure 134/60, saturating 96% on room air.  GENERAL: Well-nourished, well-developed, Caucasian gentleman who is currently in no acute distress.  HEAD: Normocephalic, atraumatic.  EYES: Pupils equal, round, reactive to light. Extraocular muscles intact. No scleral icterus. He does have approximately 6 beats of leftward gaze nystagmus.  EARS, NOSE, THROAT: Throat clear without exudates. No external lesions.  NECK: Supple. No thyromegaly. No nodules. No JVD.  PULMONARY: Clear to auscultation bilaterally. No wheezes, rubs or rhonchi. No use of accessory muscles. Good respiratory effort.  CHEST: Nontender to palpation.  CARDIOVASCULAR: S1, S2, regular rate and rhythm. No murmurs, rubs or gallops. No edema. Pedal pulses 2+ bilaterally.  GASTROINTESTINAL: Soft, nontender, nondistended. No masses. Positive bowel sounds. No hepatosplenomegaly.  MUSCULOSKELETAL: No swelling, clubbing or edema. Range of motion full in all extremities.  NEUROLOGIC: Cranial nerves II through XII intact. No gross focal neurological deficits. Sensation intact. Reflexes intact. He does have a resting tremor as well as an active tremor which he states is unchanged at baseline, most prominent in his hands bilaterally and right leg. Pronator drift  within normal limits.  Gait was deferred.  SKIN: No ulcerations, lesions, rashes or cyanosis. Skin warm, dry. Turgor is intact.  PSYCHIATRIC: Mood and affect within normal limits. The patient is awake, alert and oriented x 3. Insight and judgment intact.   LABORATORY DATA: Sodium 128, potassium 4.2, chloride 97, bicarb 25, BUN 11, creatinine 0.85, glucose 132. LFTs within normal limits. Troponin I less than 0.02. WBC 9.9, hemoglobin 14.1, platelets of 197. CT head: No acute intracranial process. EKG: Sinus bradycardia, heart rate of 54. No ST or T wave abnormalities.   ASSESSMENT AND PLAN: A 64 year old Caucasian gentleman with history of Parkinson's-like disorder, presenting with acute onset of nausea, vomiting, dizziness.  1. Transient ischemic attack: He has symptoms concerning for transient ischemic attack given acute onset of symptoms and associated nystagmus; however, they are no resolved. Initial CT head was normal. Will give aspirin and Lipitor. Will obtain an MRI and consult neurology, Dr. Melrose Nakayama. He has completed a swallow study and passed without any difficulty. He can start on a regular diet.  2. Hyponatremia: Intravenous fluid hydration with normal saline and follow his basic metabolic panel.  3. Venous thromboembolism prophylaxis with heparin subcutaneous.   The patient is FULL CODE.   TIME SPENT: 45 minutes.   ____________________________ Aaron Mose. Marcena Dias, MD dkh:gb D: 04/30/2013 22:05:59 ET T: 04/30/2013 23:05:07 ET JOB#: [id number]  cc: Aaron Mose. Grisel Blumenstock, MD, <Dictator> Erasto Sleight Woodfin Ganja MD ELECTRONICALLY SIGNED 05/01/2013 2:40

## 2014-09-26 NOTE — Discharge Summary (Signed)
PATIENT NAME:  Christian Anderson, SISEMORE MR#:  [id number] DATE OF BIRTH:  [birth year]  DATE OF ADMISSION:  04/30/2013 DATE OF DISCHARGE:  05/01/2013  PRIMARY CARE PHYSICIAN: Georgie Chard, MD  FINAL DIAGNOSES:  1.  Nausea, vomiting, dizziness.  2.  Hyponatremia.  3.  Movement disorder.  4.  Hypertension.  5.  Depression.   MEDICATIONS ON DISCHARGE: Include nadolol 80 mg one and a half tabs twice a day, Requip 0.5 mg three times a day, gabapentin 300 mg three times a day, Celexa 20 mg daily, cyclobenzaprine 10 mg at bedtime, benztropine 1 mg at bedtime, lisinopril 20 mg twice a day, aspirin 325 mg daily, Cialis p.r.n., omeprazole 20 mg daily, Zofran ODT 8 mg every 6 hours as needed for nausea.   DIET: Regular diet. Stay hydrated. Regular consistency. Chew foods thoroughly.   ACTIVITY: As tolerated.   FOLLOWUP: With Dr. Melrose Nakayama, neurology, as scheduled. Dr. Rayann Heman, gastroenterology. Will call you to set up endoscopy, and 1 to 2 weeks with Dr. Doy Hutching.   HOSPITAL COURSE: The patient was admitted as an observation on 04/30/2013, discharged on 05/01/2013.   CHIEF COMPLAINT: Presented with acute nausea, vomiting, dizziness, headache.   Suspected TIA on presentation. An MRI of the brain was ordered. For his hyponatremia, he was given IV fluids and Bumex was held.   LABORATORY AND RADIOLOGICAL DATA DURING THE HOSPITAL COURSE: EKG showed sinus bradycardia, otherwise normal.   Troponin negative. Lipase 116, and glucose 132, BUN 11, creatinine 0.85, sodium 128, potassium 4.2, chloride 97, CO2 25, calcium 8.4. Liver function tests normal range. White blood cell count 9.9, H and H 14.1 and 41.4, platelet count 197.   CT scan of the head: No acute abnormality.   CHEST X-RAY: No active disease.   Repeat sodium 132, LDL 70, HDL 47, triglycerides 102. MRI of the brain showed no acute intracranial abnormality, mild to moderate age, nonspecific cerebral white matter signal changes, most likely due to chronic small  vessel disease.   Urinalysis was negative except for 1+ ketones.   HOSPITAL COURSE PER PROBLEM LIST:  1.  For the patient's nausea, vomiting and dizziness, the patient's headache had disappeared. Has not vomited since 6:00 p.m. last night. Still a little nauseous but was able to eat a little lunch. This is likely secondary to acute gastroenteritis. Since the patient ate lunch, I will send him home for close clinical follow-up. He did complain of some dysphagia to solids. I did speak with Dr. Rayann Heman, gastroenterologist. His office will call and set up an endoscopy as outpatient.  2.  Hyponatremia. I believe this is all secondary to dehydration with the nausea and vomiting. Bumex could be contributing. That will be held for the next few days at least. Celexa can also cause this, but unlikely since he has been on this medication for a while. Recommend checking a BMP as outpatient.  3.  Movement disorder. Has been seen by numerous specialists and they are adding medications. Not much seems to be helping. I do recommend following up with Dr. Melrose Nakayama as outpatient and possibly eliminating some of these medications. Consult was ordered by my associate, but the patient was feeling better where he was able to go home and will follow up with Dr. Melrose Nakayama as an outpatient.  4.  Hypertension. Blood pressure stable on nadolol.  5.  Depression, on Celexa.   TIME SPENT ON DISCHARGE: 40 minutes with coordination of care.   Of note, this is not a stroke. MRI of the brain  was negative.    ____________________________ Tana Conch. Leslye Peer, MD rjw:np D: 05/01/2013 14:11:06 ET T: 05/01/2013 14:57:42 ET JOB#: [id number]  cc: Tana Conch. Leslye Peer, MD, <Dictator> Leonie Douglas. Doy Hutching, MD Marisue Brooklyn MD ELECTRONICALLY SIGNED 05/04/2013 15:17

## 2014-09-27 NOTE — Consult Note (Signed)
Brief Consult Note: Diagnosis: Delirium, r/o Lewy Body dementia.   Patient was seen by consultant.   Orders entered.   Comments: Pt seen for follow up in presence of his wife. he remains confused and was confabulating. He was able to tell me her name as a separate person, which he has met yesterday morning. later on he stated that he has been married and said that his wife has a long last name. Able to tell his mother's name. Asked me how my "play is going " .He was noted to be hallucinating and talking to someone next to him. His wife stated that she is willing to take him home, if he is more stable as she feels that he will become more difficult to handle if agitated.  Staff reported that he is more calm since Seroquel and Namenda were added. He is responding to the medications well. However, will decrease Seroquel dose at the request of Neurology.   Plan: Continue Namenda 47m po qdaily.  Seroquel 216mpo BID Please avoid other atypical antipsychotics as they will worsen his condition.  Seroquel can be used prn as well in smaller doses.  Wife is working with CM for placement.   Thank you for allowing me to participate in care of this pt.  Electronic Signatures: FaJeronimo NormaMD)  (Signed 07-May-15 14:56)  Authored: Brief Consult Note   Last Updated: 07-May-15 14:56 by FaJeronimo NormaMD)

## 2014-09-27 NOTE — Consult Note (Signed)
PATIENT NAME:  Christian Anderson, Christian Anderson MR#:  [id number] DATE OF BIRTH:  13-Jan-1951  DATE OF CONSULTATION:  10/08/2013  REFERRING PHYSICIAN:  Fulton Reek, MD CONSULTING PHYSICIAN:  Cordelia Pen. Gretel Acre, MD  REASON FOR CONSULTATION: Delirium.   HISTORY OF PRESENT ILLNESS: The patient is a 64 year old married male with history of tremors and hallucinations for the past few who presented to the ED for evaluation of worsening hallucinations and recurrent falls. Psychiatric consult was placed by his primary physician, Dr. Doy Hutching. According to the initial assessment, the patient has a long history of tremors and hallucinations and is currently following with Dr. Melrose Nakayama as his neurologist. He has been living with his wife and initial history was obtained from the patient who was noted to be confused and was confabulating during the history. He reported that he lives in [place]. He also mentioned about drinking 1 to 2 beers per night. However, he was unable to provide much coherent history during the interview. Collateral information was obtained from his wife. She reported that the patient developed tremors in his right hand starting 25 years ago which were first intermittent, but they progressed steadily over the years. He started seeking treatment when they lived in [place] and then he started following with another neurologist as he moved to [place]. She reported that he was given medications and he responded very well to benztropine. She reported that he started having visual hallucinations and initially the hallucinations were intermittent, but now they are getting more bizarre. He also has short-term memory problems. He started having visual hallucinations and bizarre visual hallucinations including thinking that his wife as being his sister. He has been following with Dr. Annitta Jersey as his psychiatrist since January. Dr. Annitta Jersey has given him Cymbalta for his depressive symptoms, but he did not respond well to the  medications. She thinks that most of his problems are neurological. She reported that the patient's walking has become progressively more unsteady and he has started falling often. The patient does not have any increasing difficultly initiating gait. His wife brought him to the hospital because of his worsening hallucinations and cognitive difficulties. She reported that he responded well to the benztropine and Dr. Annitta Jersey also gave him Abilify which did not help much. The patient has seen many neurologists in [place] as well as in [place]. He does not have any suicidal or homicidal ideations or plans.   PAST PSYCHIATRIC HISTORY: The patient currently follows with Dr. Annitta Jersey in outpatient psychiatry. He is currently on the combination of Abilify, Cymbalta, clonazepam and Ambien to help him with sleep. However, he has been having recurrent falls recently. The wife is concerned about his falls.   PAST MEDICAL HISTORY: Hypertension, varicose veins, bilateral hip replacement surgery.  CURRENT MEDICATIONS: Nadolol 80 mg twice daily, omeprazole 40 mg p.o. daily, bumetanide 0.5 mg twice daily, clonazepam 1 mg 3 times daily, Zolpidem 10 mg once daily, vitamin B12 1000 mcg daily, melatonin 5 mg at bedtime, Abilify 5 mg daily, cyclobenzaprine 10 mg once a day, benztropine 1 mg once daily, lisinopril 20 mg twice daily, aspirin 325 mg daily.   ALLERGIES: No known drug allergies.   SOCIAL HISTORY: The patient currently lives with his wife. He drinks 2 to 3 beers per day for many years. Denies use of other illicit drugs.   FAMILY HISTORY: No history of any psychiatric illness and no history of suicide in the family.   ANCILLARY DATA: Vital signs: Temperature 98.6, pulse 64, respirations 18, blood pressure 165/76.  LABORATORY DATA: Glucose 132, BUN 11, creatinine 0.93, sodium 136, potassium 4.6, chloride 103, bicarbonate 30. Anion gap 3. Osmolality 273, calcium 9.1, protein 6.8, albumin 3.2, bilirubin 0.5, alk phos 82,  AST 20, ALT 28. TSH 0.987. UDS positive for tricyclic antidepressants. WBC 6, RBC 4.38, hemoglobin 13.5, hematocrit 40.3, MCV 92, RDW 13.4.   REVIEW OF SYSTEMS: CONSTITUTIONAL: No fever or chills. No weight changes.  EYES: No double or blurred vision.  RESPIRATORY: No shortness of breath or cough.  CARDIOVASCULAR: No chest pain or orthopnea.  GASTROINTESTINAL: No abdominal pain, nausea, vomiting, diarrhea.  GENITOURINARY: No incontinence or frequency.  ENDOCRINE: No heat or cold intolerance.  LYMPHATIC: No anemia or easy bruising.  INTEGUMENTARY: No acne or rash.  MUSCULOSKELETAL: Has weakness.  NEUROLOGIC: Frequent falls.   MENTAL STATUS EXAMINATION: The patient is a moderately built male who was lying in the bed. He appeared disheveled. Muscle tone appears weak with frequent falls. Speech was circumstantial and unable to communicate well. Thought process was tangential. Loose associations are noted. Thought content: Has visual hallucinations. Denied any suicidal or homicidal ideations or plans. The patient's insight and judgment were poor. He was awake but not oriented to time, place or person. Recent and remote memory were impaired. Has short attention span. Language was fair. Mood was anxious. Affect was congruent.   DIAGNOSTIC IMPRESSION: AXIS I: 1.  Delirium. 2.  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  3.  Rule out dementia.   TREATMENT PLAN: 1.  I discussed with his wife at length about the medications, treatment, risks, benefits and alternatives. I will start him on Seroquel 25 mg p.o. t.i.d. to help with his hallucinations and anxiety.  2.  I will also start him on Namenda 5 mg p.o. daily for his dementia. I discussed with his wife about the plan and she agreed with this plan at this time.   Thank you for allowing me to participate in the care of this patient.   ____________________________ Cordelia Pen. Gretel Acre, MD usf:sb D: 10/08/2013 15:42:54 ET T: 10/08/2013 17:17:31  ET JOB#: [id number]  cc: Cordelia Pen. Gretel Acre, MD, <Dictator> Jeronimo Norma MD ELECTRONICALLY SIGNED 10/10/2013 10:20

## 2014-09-27 NOTE — Discharge Summary (Signed)
PATIENT NAME:  Christian Anderson, Christian Anderson MR#:  [id number] DATE OF BIRTH:  March 28, 1951  DATE OF ADMISSION:  10/07/2013 DATE OF DISCHARGE:  10/14/2013  REASON FOR ADMISSION: Altered mental status.   HISTORY OF PRESENT ILLNESS: Please see the dictated history of present illness done by Dr. Bridgett Larsson on 10/07/2013.   PAST MEDICAL HISTORY: 1. Parkinsonism.  2. Benign hypertension.  3. Obesity.  4. Status post bilateral hip surgeries.   MEDICATIONS ON ADMISSION: Please see admission note.   ALLERGIES: No known drug allergies.   SOCIAL HISTORY, FAMILY HISTORY AND REVIEW OF SYSTEMS: As per admission note.   PHYSICAL EXAM: GENERAL: The patient was in no acute distress.  VITAL SIGNS: Stable and he was afebrile. HEENT: Exam was unremarkable.  NECK: Supple without JVD.  LUNGS: Clear.  CARDIAC: Revealed a regular rate and rhythm with normal S1, S2.  ABDOMEN: Soft and nontender.  EXTREMITIES: Without edema.  NEUROLOGIC: Exam was grossly nonfocal.   LABORATORY DATA: Essentially unremarkable.   HOSPITAL COURSE: The patient was admitted with confusion and hallucinations. Also hypertension. The patient was seen by neurology. Repeat MRI of the brain was unremarkable. Lumbar puncture was unremarkable. Multiple specialty labs were sent off and were nondiagnostic. He was seen by psychiatry as well. His medications were adjusted. He initially remained severely agitated and confused, but this improved with medication adjustments. The patient calmed down. Placement was considered, but the family deferred. They want to take him home. The patient's confusion and hallucinations improved with medical therapy. By 10/14/2013, the patient was stable and ready for discharge.   DISCHARGE DIAGNOSES: 1. Altered mental status.  2. Metabolic encephalopathy.  3. Delirium.  4. Parkinsonism.  5. Benign hypertension.   DISCHARGE MEDICATIONS: 1. Vitamin B12 1000 mcg p.o. daily.  2. Nadolol 80 mg p.o. b.i.d.  3. Bumex 0.5 mg p.o.  daily.  4. Lisinopril 20 mg p.o. daily.  5. Aspirin 325 mg p.o. daily.  6. Seroquel 25 mg p.o. b.i.d.  7. Xanax 0.25 mg p.o. at bedtime.  8. Lorazepam 1 mg p.o. q.4 hours p.r.n. agitation.  9. Namenda 5 mg p.o. daily.  10. Protonix 40 mg p.o. daily.   FOLLOW-UP PLANS AND APPOINTMENTS: The patient was discharged with home health on a low sodium diet. He will follow up with me in the office within three days, sooner if needed.   ____________________________ Leonie Douglas Doy Hutching, MD jds:sg D: 10/22/2013 07:20:21 ET T: 10/22/2013 07:47:50 ET JOB#: [id number]  cc: Leonie Douglas. Doy Hutching, MD, <Dictator> Gwendolin Briel Lennice Sites MD ELECTRONICALLY SIGNED 10/22/2013 20:24

## 2014-09-27 NOTE — H&P (Signed)
PATIENT NAME:  Christian Anderson, FRANCHINI MR#:  [id number] DATE OF BIRTH:  1951/01/30  DATE OF ADMISSION:  10/07/2013  REFERRING PHYSICIAN:  Ahmed Prima, MD.  CHIEF COMPLAINT: Worsening hallucination for a month, frequent falls recently.   HISTORY OF PRESENT ILLNESS: The patient is a 64 year old Caucasian male with a history of Parkinson-like movement disorder, hypertension, was sent to the ED due to confusion and hallucination. Patient is confused, only knows his name.  He does not know where he is.  He does not know the time. He does not know why he is here, but he denies any symptoms.  According to the patient's wife, patient has had a tremor for years but recently developed hallucinations for the past few months. The patient also has frequent falls. So patient was sent to the ED for further evaluation. The patient is following up with Dr. Melrose Nakayama for tremors and Parkinson-like movement disorder. Has been on many medications.   PAST MEDICAL HISTORY:  Parkinson-like movement disorder, hypertension.   SURGICAL HISTORY: Bilateral hip replacement.   SOCIAL HISTORY: No smoking, but drinks beer, 2 or 3 cans every day for years. Denies any drug abuse.   FAMILY HISTORY: Positive for CAD and diabetes.   HOME MEDICATIONS:  1. Zolpidem 10 mg p.o. at bedtime. 2. Vitamin B12 1000 mcg p.o. once a day.  3. Omeprazole 40 mg p.o. daily.  4. Nadolol 80 mg p.o. b.i.d.  5. Melatonin 5 mg p.o. at bedtime.  6. Lisinopril 20 mg p.o. b.i.d.  7. Cyclobenzaprine 10 mg p.o. at bedtime.  8. Clonazepam 1 mg p.o. t.i.d.  9. Bumetanide 0.5 mg p.o. once a day.  10. Benztropine 1 mg p.o. at bedtime.  11. Aspirin 325 mg p.o. daily.  12. Abilify 5 mg p.o. daily.  REVIEW OF SYSTEMS:  CONSTITUTIONAL: The patient is confused, only knows his name.  He denies any symptoms. Review of systems is not reliable.   VITAL SIGNS: Temperature 98.6, blood pressure 157/65, pulse 61, respirations 18, oxygen saturation 100% on room air.    PHYSICAL EXAMINATION:  GENERAL: The patient is weak, following up commands, in no acute distress.  HEENT: Pupils round, equal, and reactive to light and accommodation. Moist oral mucosa. Clear oropharynx.  NECK: Supple. No JVD or carotid bruits are noted. No lymphadenopathy. No thyromegaly.  CARDIOVASCULAR: S1 and S2. Regular rate and rhythm. No murmurs, gallops.  PULMONARY: Bilateral air entry. No wheezing or rales. No use of accessory muscle to breathe.  ABDOMEN: Soft. No distention or tenderness. No organomegaly. Bowel sounds are present.  EXTREMITIES: No edema, clubbing, or cyanosis. No calf tenderness. Bilateral pedal pulses present.  SKIN: No rash or jaundice.  NEUROLOGIC: The patient is alert, awake, only knows his name. Follow up commands. No focal deficit. Power 5/5. Sensory intact.   LABORATORY DATA: CBC in normal range. Glucose 132, BUN 11, creatinine 0.93. Electrolytes are normal. Troponin less than 0.02. Urinalysis is negative. Urine drug screen showed tricyclic antidepressant positive, otherwise negative. EKG showed normal sinus rhythm at 68 BPM.   IMPRESSIONS:  1. Confusion and hallucination, possibly due to medication.  2. Hypertension.  3. Alcohol abuse.   PLAN OF TREATMENT:  1. The patient will be placed for observation. We will hold zolpidem, cyclobenzaprine, clonazepam, and Abilify.  We will get a neurology consult from Dr. Melrose Nakayama.  2. For hypertension, we will continue hypertension medication. 3. For alcohol abuse, we will start CIWA protocol. I discussed the patient's condition and plan of treatment with patient's  wife.   TIME SPENT: About 58 minutes.    ____________________________ Demetrios Loll, MD qc:dd D: 10/07/2013 18:09:50 ET T: 10/07/2013 19:00:57 ET JOB#: [id number]  cc: Demetrios Loll, MD, <Dictator> Demetrios Loll MD ELECTRONICALLY SIGNED 10/07/2013 21:49

## 2014-09-27 NOTE — Consult Note (Signed)
Referring Physician:  Sheppard Coil, Qing :   Reason for Consult: Admit Date: 07-Oct-2013  Chief Complaint: "We might be able to discuss some business interests"  Reason for Consult: altered mental status   History of Present Illness: History of Present Illness:   Christian Anderson is a 64 yo man with PMH notable for HTN, bilateral hip replacement, and tremors and hallucinations for the past few years who presented to [facility] ED for evaluation of worsening hallucinations and difficulty walking. The neurology service has been consulted to assist in the evaluation and management of the acute worsening of these chronic complaints. His wife provides the history. patient first developed tremors in his right hand starting 25 years ago, which were at first intermittent and mild but progressed steadily over years to involve the right leg and then eventually the left hand. Visual hallucinations as well as problems completing complex tasks started 3-4 years ago and have progressively worsened. More recently, over the past month or so, the hallucinations have become much more intense and frequent, and his confusion and short-term memory problems seem to have worsened along with the hallucinations. The hallucinations are visual only in nature but have become more bizarre, such as a recurring hallucination of a skeleton in a conquistador hat. For years the patient has had fair insight into the artificial nature of the hallucinations, but in the past few weeks he seems to be becoming "unglued" per his wife. Occasionally he will now mistake his wife for his sister (who is alive and lives in a different town).  walking has also become progressively more unsteady over the past few years, and he has suffered more and more frequent falls. He does not have freezing or difficulty initiating gait, but rather seems to have more difficulty with balance. However, the reason his wife brought him into the hospital was her  concern over rapidly worsening hallucinations and cognitive difficulties. has been seen by multiple neurologists in the past, starting 25 years ago in [place] where he lived at the time. More recently he has been followed by a neurologist named Dr. Melrose Nakayama here in [place], who was planning on getting a brain MRI later this month pert the patient's wife.  ROS:  Review of Systems   Unable to obtain accurate ROS due to patient's poor historical skills and disorientation.  Past Medical/Surgical Hx:  hypertension:   varicose vein removal from left leg:   bilateral hip replacement:   Home Medications: Medication Instructions Last Modified Date/Time  nadolol 80 mg oral tablet 1 tab(s) orally 2 times a day 04-May-15 13:50  omeprazole 40 mg oral delayed release capsule 1 cap(s) orally once a day 04-May-15 13:50  bumetanide 0.5 mg oral tablet 1 tab(s) orally once a day 04-May-15 13:50  clonazePAM 1 mg oral tablet 1 tab(s) orally 3 times a day 04-May-15 13:50  zolpidem 10 mg oral tablet 1 tab(s) orally once a day (at bedtime) 04-May-15 13:50  Vitamin B-12 1000 mcg oral tablet 1 tab(s) orally once a day 04-May-15 13:50  Melatonin 5 mg oral tablet 1 tab(s) orally once a day (at bedtime) 04-May-15 13:50  Abilify 5 mg oral tablet 1 tab(s) orally once a day 04-May-15 13:50  cyclobenzaprine 10 mg oral tablet 1 tab(s) orally once a day (at bedtime) 04-May-15 13:50  benztropine 1 mg oral tablet 1 tab(s) orally once a day (at bedtime) 04-May-15 13:50  lisinopril 20 mg oral tablet 1 tab(s) orally 2 times a day 04-May-15 13:50  aspirin 325 mg oral tablet 1 tab(s) orally once a day 04-May-15 13:50   Allergies:  No Known Allergies:   Social/Family History: Lives With: spouse  Social History: Drinks 2-3 beers per day for years. No smoking or illicit drug use.  Family History: Positive for CAD and diabetes   Vital Signs: **Vital Signs.:   05-May-15 09:36  Vital Signs Type Q 3hr  Temperature Temperature (F)  97.9  Celsius 36.6  Temperature Source oral  Pulse Pulse 75  Respirations Respirations 18  Systolic BP Systolic BP 998  Diastolic BP (mmHg) Diastolic BP (mmHg) 85  Mean BP 104  Pulse Ox % Pulse Ox % 98  Pulse Ox Activity Level  At rest  Oxygen Delivery Room Air/ 21 %   EXAM: Well-developed, well-nourished, in NAD. No conjunctival injection or scleral edema. Oropharynx clear. No carotid bruits auscultated. Normal S1, S2 and regular cardiac rhythm on exam. Lungs clear to auscultation bilaterally. Abdomen soft and nontender. Peripheral pulses palpated. No clubbing, cyanosis, or edema in extremities.  MENTAL STATUS: Alert and oriented to person only. Place is his house, date is January 24, 2024, and the current situation is that we are having a business meeting in his home office. He frequently confabulates business-related answers to my various mental status exam questions. He is able to follow simple commands though completely unable to follow complex, multi-step directions. When instructed to perform the Luria sequence, he simply bashes his fists together. Echopraxia is present, but not echolalia. He has mild hypomimia but no masked facies. He laughs inappropriately at times. I am unable to test memory or delayed recall due to his confabulation and tangential thought process. He denies any hallucinations at the time of my exam. CRANIAL NERVES: Visual fields full to confrontation. PERRL. EOMI. Facial sensation intact. Facial muscles full and symmetric. Hearing intact to finger rub. Uvula midline with symmetric palatal elevation. Tongue midline without fasciculations. MOTOR: Normal muscle bulk, tone difficult to evaluate due to marked gegenhalten paratonia. However, I am unable to appreciate any cogwheeling rigidity. There is no obvious bradykinesia though he has difficulty complying with rapid movements.  Strength 5/5 in deltoids, biceps, triceps, wrist flexors and extensors, and hand intrinsics  bilaterally. Strength 5/5 in iliopsoas, glutes, hamstrings, quads, and tib ant bilaterally. REFLEXES: 2+ in biceps, triceps, patella, and achilles bilaterally. Flexor plantar responses bilaterally. SENSORY: Intact to vibration and pinprick throughout. COORDINATION: No ataxia or dysmetria on finger-nose or heel-shin maneuvers. GAIT: Not tested.  Lab Results: Thyroid:  05-May-15 04:24   Thyroid Stimulating Hormone 0.987 (0.45-4.50 (International Unit)  ----------------------- Pregnant patients have  different reference  ranges for TSH:  - - - - - - - - - -  Pregnant, first trimetser:  0.36 - 2.50 uIU/mL)  Hepatic:  04-May-15 13:34   Bilirubin, Total 0.5  Alkaline Phosphatase 82 (45-117 NOTE: New Reference Range 04/26/13)  SGPT (ALT) 28  SGOT (AST) 20  Total Protein, Serum 6.8  Albumin, Serum  3.2  Routine Chem:  04-May-15 13:34   Glucose, Serum  132  BUN 11  Creatinine (comp) 0.93  Sodium, Serum 136  Potassium, Serum 4.6  Chloride, Serum 103  CO2, Serum 30  Calcium (Total), Serum 9.1  Osmolality (calc) 273  eGFR (African American) >60  eGFR (Non-African American) >60 (eGFR values <17m/min/1.73 m2 may be an indication of chronic kidney disease (CKD). Calculated eGFR is useful in patients with stable renal function. The eGFR calculation will not be reliable in acutely ill patients when serum creatinine  is changing rapidly. It is not useful in  patients on dialysis. The eGFR calculation may not be applicable to patients at the low and high extremes of body sizes, pregnant women, and vegetarians.)  Anion Gap  3  Urine Drugs:  02-OVZ-85 88:50   Tricyclic Antidepressant, Ur Qual (comp) POSITIVE (Result(s) reported on 07 Oct 2013 at 02:51PM.)  Amphetamines, Urine Qual. NEGATIVE  MDMA, Urine Qual. NEGATIVE  Cocaine Metabolite, Urine Qual. NEGATIVE  Opiate, Urine qual NEGATIVE  Phencyclidine, Urine Qual. NEGATIVE  Cannabinoid, Urine Qual. NEGATIVE  Barbiturates, Urine  Qual. NEGATIVE  Benzodiazepine, Urine Qual. NEGATIVE (----------------- The URINE DRUG SCREEN provides only a preliminary, unconfirmed analytical test result and should not be used for non-medical  purposes.  Clinical consideration and professional judgment should be  applied to any positive drug screen result due to possible interfering substances.  A more specific alternate chemical method must be used in order to obtain a confirmed analytical result.  Gas chromatography/mass spectrometry (GC/MS) is the preferred confirmatory method.)  Methadone, Urine Qual. NEGATIVE  Cardiac:  04-May-15 13:34   Troponin I < 0.02 (0.00-0.05 0.05 ng/mL or less: NEGATIVE  Repeat testing in 3-6 hrs  if clinically indicated. >0.05 ng/mL: POTENTIAL  MYOCARDIAL INJURY. Repeat  testing in 3-6 hrs if  clinically indicated. NOTE: An increase or decrease  of 30% or more on serial  testing suggests a  clinically important change)  Routine UA:  04-May-15 13:33   Color (UA) Yellow  Clarity (UA) Clear  Glucose (UA) Negative  Bilirubin (UA) Negative  Ketones (UA) Negative  Specific Gravity (UA) 1.012  Blood (UA) Negative  pH (UA) 7.0  Protein (UA) Negative  Nitrite (UA) Negative  Leukocyte Esterase (UA) Negative (Result(s) reported on 07 Oct 2013 at 02:28PM.)  RBC (UA) NONE SEEN  WBC (UA) NONE SEEN  Bacteria (UA) NONE SEEN  Epithelial Cells (UA) <1 /HPF (Result(s) reported on 07 Oct 2013 at 02:28PM.)  Routine Hem:  04-May-15 13:34   WBC (CBC) 6.0  RBC (CBC)  4.38  Hemoglobin (CBC) 13.5  Hematocrit (CBC) 40.3  Platelet Count (CBC) 201 (Result(s) reported on 07 Oct 2013 at 01:58PM.)  MCV 92  MCH 30.9  MCHC 33.6  RDW 13.4   Impression/Recommendations: Recommendations:   Christian Anderson is a 64 yo man with a history for years of tremors and visual hallucinations which have rapidly deteriorated over the last month along with his cognitive status. His exam shows what I consider to be a surprising  lack of Parkinsonism or any extrapyramidal signs. Mental status is notable for disorganized thought process, disorientation, impaired executive function with echopraxia, as well as confabulation.  is a complicated syndrome with a broad differential diagnosis. Parkinson's dementia or dementia with Lewy bodies are less likely given the relative lack of Parkinsonism on exam (no tremor at the time of my exam either). Rapidly progressive dementia syndromes like CJD are less likely given the 3-4 year time course of visual hallucinations (rapidly progressive dementias would be expected to progress more quickly in most cases). Other neurodegenerative processes like frontotemporal dementia or Alzheimer's dementia are possible, though his presentation is atypical for both. I am not sure that his history of tremor is necessarily linked to his cognitive process, and they also seem to be temporally distinct.   Recommendations: - MRI brain without contrast to evaluate for patterns of atrophy to assist in diagnosisHallucinations could be treated with an antidopaminergic agent (e.g. haloperidol) as needed acutely, as I do not believe he likely  has ParkinsonismWould defer LP for now pending MRI findings. History is not strongly suggestive of prion disease given the extended time course, though this is still possibleRoutine EEG to help characterize encephalopathy and rule out subclinical seizure activityExpedite short-term follow up with his primary neurologist Dr. Leroy Sea as an outpatient you for the opportunity to participate in Christian Anderson care. Will follow along with you daily  Electronic Signatures: Carmin Richmond (MD)  (Signed [phone] 11:27)  Authored: REFERRING PHYSICIAN, Consult, History of Present Illness, Review of Systems, PAST MEDICAL/SURGICAL HISTORY, HOME MEDICATIONS, ALLERGIES, Social/Family History, NURSING VITAL SIGNS, Physical Exam-, LAB RESULTS, Recommendations   Last Updated: 05-May-15 11:27 by  Carmin Richmond (MD)

## 2014-12-18 ENCOUNTER — Other Ambulatory Visit: Payer: Self-pay

## 2014-12-23 ENCOUNTER — Encounter: Payer: Self-pay | Admitting: Psychiatry

## 2014-12-23 ENCOUNTER — Ambulatory Visit (INDEPENDENT_AMBULATORY_CARE_PROVIDER_SITE_OTHER): Payer: Self-pay | Admitting: Psychiatry

## 2014-12-23 VITALS — BP 122/70 | HR 58 | Temp 98.2°F | Ht 70.0 in | Wt 238.8 lb

## 2014-12-23 DIAGNOSIS — F29 Unspecified psychosis not due to a substance or known physiological condition: Secondary | ICD-10-CM | POA: Insufficient documentation

## 2014-12-23 DIAGNOSIS — F05 Delirium due to known physiological condition: Secondary | ICD-10-CM | POA: Insufficient documentation

## 2014-12-23 DIAGNOSIS — Z8679 Personal history of other diseases of the circulatory system: Secondary | ICD-10-CM | POA: Insufficient documentation

## 2014-12-23 DIAGNOSIS — Z87898 Personal history of other specified conditions: Secondary | ICD-10-CM | POA: Insufficient documentation

## 2014-12-23 DIAGNOSIS — F411 Generalized anxiety disorder: Secondary | ICD-10-CM | POA: Insufficient documentation

## 2014-12-23 DIAGNOSIS — R441 Visual hallucinations: Secondary | ICD-10-CM | POA: Insufficient documentation

## 2014-12-23 DIAGNOSIS — Z8739 Personal history of other diseases of the musculoskeletal system and connective tissue: Secondary | ICD-10-CM | POA: Insufficient documentation

## 2014-12-23 DIAGNOSIS — S060X9A Concussion with loss of consciousness of unspecified duration, initial encounter: Secondary | ICD-10-CM | POA: Insufficient documentation

## 2014-12-23 DIAGNOSIS — G4730 Sleep apnea, unspecified: Secondary | ICD-10-CM | POA: Insufficient documentation

## 2014-12-23 DIAGNOSIS — G20A1 Parkinson's disease without dyskinesia, without mention of fluctuations: Secondary | ICD-10-CM | POA: Insufficient documentation

## 2014-12-23 DIAGNOSIS — G4700 Insomnia, unspecified: Secondary | ICD-10-CM | POA: Insufficient documentation

## 2014-12-23 DIAGNOSIS — Z8719 Personal history of other diseases of the digestive system: Secondary | ICD-10-CM | POA: Insufficient documentation

## 2014-12-23 DIAGNOSIS — G2581 Restless legs syndrome: Secondary | ICD-10-CM | POA: Insufficient documentation

## 2014-12-23 DIAGNOSIS — F321 Major depressive disorder, single episode, moderate: Secondary | ICD-10-CM

## 2014-12-23 DIAGNOSIS — R4182 Altered mental status, unspecified: Secondary | ICD-10-CM | POA: Insufficient documentation

## 2014-12-23 DIAGNOSIS — S060X0A Concussion without loss of consciousness, initial encounter: Secondary | ICD-10-CM | POA: Insufficient documentation

## 2014-12-23 DIAGNOSIS — G20 Parkinson's disease: Secondary | ICD-10-CM | POA: Insufficient documentation

## 2014-12-23 DIAGNOSIS — Z8639 Personal history of other endocrine, nutritional and metabolic disease: Secondary | ICD-10-CM | POA: Insufficient documentation

## 2014-12-23 DIAGNOSIS — S060XAA Concussion with loss of consciousness status unknown, initial encounter: Secondary | ICD-10-CM | POA: Insufficient documentation

## 2014-12-23 MED ORDER — SERTRALINE HCL 50 MG PO TABS: 50.0000 mg | ORAL_TABLET | Freq: Every day | ORAL | Status: DC

## 2014-12-23 MED ORDER — TEMAZEPAM 30 MG PO CAPS: 30.0000 mg | ORAL_CAPSULE | Freq: Every day | ORAL | Status: DC

## 2014-12-23 NOTE — Progress Notes (Signed)
[facility] MD/PA/NP OP Progress Note  12/23/2014 11:37 AM Lurene Shadow.  MRN:  [id number]  Subjective:  Ivin Booty returns her follow-up was major depressive disorder and generalized anxiety disorder. He relates that he has his upcoming follow-up appointment after 1 year for his Parkinson's disease. He states that will occur next week on 12/30/2014.  States the 2 issues he has noticed that his sleep has become irregular. He states he might sleep about 4 hours a night. However he states there are days where he just has to have a nap during the daytime and he is aware that will affect his sleep at night. He states he does try to stay active during the day, doing volunteer work and exercising 3 times a week. He is taking his to lorazepam with intermittent results. He states the other issue he notices that he feels like when he is conversing with others that his language is scrambled and that he is "babbling." However his wife who is present for this appointment indicates that his speech is clear and coherent. We discussed whether this was perhaps some of his anxiety playing a role in his assessment of himself. Patient has concerns that both the sleep disturbance and the feelings that his speech is disorganized may be an indication of progression of his Parkinson's disease. We have decided that he will wait for his appointment with his neurologist to see whether there is any feeling bear that something is progressed. I discussed perhaps a trial of increasing his sertraline to address any anxiety, however patient prefers to wait until he sees his neurologist.  Patient states he's been able to engage in several activities and has several things coming up that he expects to enjoy. He states his son who is been abroad for one year has returned to the states and will live with them for some time. He states they plan to go up [place] for a wedding and were visiting his wife's relatives this summer. Chief Complaint:  Visit  Diagnosis:  No diagnosis found.  Past Medical History:  Past Medical History  Diagnosis Date  . Tremor   . Hypertension   . Hyperlipemia   . Anxiety   . Depression     Past Surgical History  Procedure Laterality Date  . Total hip arthroplasty      bilateral  . Veins stripping    . Squamous cell carcinoma excision     Family History:  Family History  Problem Relation Age of Onset  . Diabetes Father   . Heart attack Father   . Alzheimer's disease Mother   . Cancer - Ovarian Mother   . Leukemia Mother   . Breast cancer Sister   . Anxiety disorder Sister   . Depression Sister   . Headache Sister    Social History:  History   Social History  . Marital Status: Married    Spouse Name: N/A  . Number of Children: N/A  . Years of Education: N/A   Occupational History  . Research scientist (life sciences)    Social History Main Topics  . Smoking status: Former Smoker -- 0.50 packs/day    Types: Cigarettes, Cigars    Quit date: 01/29/1988  . Smokeless tobacco: Never Used     Comment: occasional cigar not had any in 3-4 years.  . Alcohol Use: 9.6 oz/week    0 Standard drinks or equivalent, 8 Glasses of wine, 8 Cans of beer, 0 Shots of liquor per week     Comment:  2-3 beers nightly, occasional wine.  . Drug Use: No  . Sexual Activity: Yes    Birth Control/ Protection: None   Other Topics Concern  . None   Social History Narrative   Additional History:   Assessment:   Musculoskeletal: Strength & Muscle Tone: within normal limits Gait & Station: normal Patient leans: N/A  Psychiatric Specialty Exam: HPI  Review of Systems  Psychiatric/Behavioral: Negative for depression, suicidal ideas, hallucinations, memory loss and substance abuse. The patient is nervous/anxious and has insomnia.     Blood pressure 122/70, pulse 58, temperature 98.2 F (36.8 C), temperature source Tympanic, height 5\' 10"  (1.778 m), weight 238 lb 12.8 oz (108.319 kg), SpO2 96 %.Body mass index is 34.26  kg/(m^2).  General Appearance: Neat and Well Groomed  Eye Contact:  Good  Speech:  Normal Rate  Volume:  Normal  Mood:  good  Affect:  Congruent  Thought Process:  Linear and Logical  Orientation:  Full (Time, Place, and Person)  Thought Content:  Negative  Suicidal Thoughts:  No  Homicidal Thoughts:  No  Memory:  Immediate;   Good Recent;   Good Remote;   Good  Judgement:  Good  Insight:  Good  Psychomotor Activity:  Negative  Concentration:  Good  Recall:  Good  Fund of Knowledge: Good  Language: Good  Akathisia:  Negative  Handed:  Right unknown  AIMS (if indicated):  N/A  Assets:  Communication Skills Desire for Improvement Social Support  ADL's:  Intact  Cognition: WNL  Sleep:  fair   Is the patient at risk to self?  No. Has the patient been a risk to self in the past 6 months?  No. Has the patient been a risk to self within the distant past?  No. Is the patient a risk to others?  No. Has the patient been a risk to others in the past 6 months?  No. Has the patient been a risk to others within the distant past?  No.  Current Medications: Current Outpatient Prescriptions  Medication Sig Dispense Refill  . amLODipine (NORVASC) 2.5 MG tablet Take by mouth.    Marland Kitchen aspirin 325 MG tablet Take 325 mg by mouth daily.    . benztropine (COGENTIN) 1 MG tablet Take 1 tablet (1 mg total) by mouth at bedtime. 30 tablet 3  . bumetanide (BUMEX) 0.5 MG tablet daily.     . carbidopa-levodopa (SINEMET IR) 25-100 MG per tablet 7 tabs by mouth daily as directed    . Cyanocobalamin (RA VITAMIN B-12 TR) 1000 MCG TBCR Take by mouth.    Marland Kitchen lisinopril (PRINIVIL,ZESTRIL) 20 MG tablet Take 20 mg by mouth daily.    . memantine (NAMENDA) 5 MG tablet TAKE 1 TABLET BY MOUTH EVERY DAY    . MYRBETRIQ 50 MG TB24 tablet TK 1 T PO BID  5  . nadolol (CORGARD) 80 MG tablet 1 1/2 bid    . pantoprazole (PROTONIX) 40 MG tablet TK 1 T PO QD  0  . primidone (MYSOLINE) 50 MG tablet Take by mouth.    .  primidone (MYSOLINE) 50 MG tablet TK 1 T PO BID  9  . senna (SENOKOT) 8.6 MG TABS tablet Take by mouth.    . sertraline (ZOLOFT) 50 MG tablet Take 1 tablet (50 mg total) by mouth daily. 30 tablet 3  . tadalafil (CIALIS) 20 MG tablet TK 1 T PO QD PRF ERECTILE DYSFUNCTION    . temazepam (RESTORIL) 30 MG capsule Take 1  capsule (30 mg total) by mouth at bedtime. 30 capsule 3  . clonazePAM (KLONOPIN) 0.5 MG tablet 2 (two) times daily as needed.     . cyclobenzaprine (FLEXERIL) 10 MG tablet     . gabapentin (NEURONTIN) 300 MG capsule 300 mg 3 (three) times daily.     Marland Kitchen rOPINIRole (REQUIP) 0.5 MG tablet 3 (three) times daily.     . simvastatin (ZOCOR) 40 MG tablet at bedtime.      No current facility-administered medications for this visit.    Medical Decision Making:  Established Problem, Stable/Improving (1)  Treatment Plan Summary:Medication management and Plan We will continue the patient's medications any changes. He will continue temazepam 30 mg at bedtime and sertraline 50 mg daily. I discussed perhaps an increase in sertraline to address his complaints of anxiety in regards to feeling he may not be speaking clearly. Patient prefers to wait on any change the increase until he seen his neurologist. This is a reasonable plan. I did discuss within the next plan might be to increase his sertraline to 75 mg daily. They indicated they will call if the neurologist felt that his symptoms aren't not related to Parkinson's disease and that a medication change to address anxiety is appropriate.   Faith Rogue 12/23/2014, 11:37 AM

## 2015-03-25 ENCOUNTER — Ambulatory Visit (INDEPENDENT_AMBULATORY_CARE_PROVIDER_SITE_OTHER): Payer: No Typology Code available for payment source | Admitting: Psychiatry

## 2015-03-25 ENCOUNTER — Encounter: Payer: Self-pay | Admitting: Psychiatry

## 2015-03-25 VITALS — BP 108/58 | HR 56 | Temp 97.9°F | Ht 70.0 in | Wt 230.2 lb

## 2015-03-25 DIAGNOSIS — G4700 Insomnia, unspecified: Secondary | ICD-10-CM

## 2015-03-25 DIAGNOSIS — F321 Major depressive disorder, single episode, moderate: Secondary | ICD-10-CM

## 2015-03-25 DIAGNOSIS — F411 Generalized anxiety disorder: Secondary | ICD-10-CM

## 2015-03-25 MED ORDER — HYDROXYZINE HCL 50 MG PO TABS: ORAL_TABLET | ORAL | Status: DC

## 2015-03-25 NOTE — Progress Notes (Signed)
[facility] MD/PA/NP OP Progress Note  03/25/2015 11:00 AM Christian Anderson.  MRN:  [id number]  Subjective:  Ivin Booty returns her follow-up was major depressive disorder and generalized anxiety disorder. She had follow-up with his neurologist to stop his primidone. He also saw his primary care physician who indicated that discontinuing the to [facility] and trying some lorazepam from his primary care increased his sertraline from 50 mg  to 100 mg. Patient reports he feels that his improved his mood. He states his biggest issue now is insomnia. His wife who is present for this appointment indicates that perhaps the patient is not responded well to the benzodiazepines because he has had a trial of the lorazepam and temazepam neither  which did not helped insomnia. Her review of the records the patient had tried some Ambien in January 2015 at 5 mg without any benefit. However he has history of sleep apnea and prescription for CPAP machine and thus we will use some caution using sleep aids. His wife stated that he might be more tense at night. She compares this to when he naps during the day when he seems more relaxed. Patient denied any anxiety at night time that he is able to detect. Wife also describes that he sometimes has tears in which she acts out at night. Given his issue with sleep apnea I will try some Vistaril that may address some anxiety that he is having at night. We also discussed how the daytime napping might interfere with his sleep at night.  For all patient reports mood is better. He continues to go to the gym 3 days a week and engage in volunteer activities such as meals on wheels.  Chief Complaint:  Chief Complaint    Follow-up; Medication Refill     Visit Diagnosis:     ICD-9-CM ICD-10-CM   1. GAD (generalized anxiety disorder) 300.02 F41.1   2. Insomnia 780.52 G47.00   3. Major depressive disorder, single episode, moderate (HCC) 296.22 F32.1     Past Medical History:  Past Medical  History  Diagnosis Date  . Tremor   . Hypertension   . Hyperlipemia   . Anxiety   . Depression     Past Surgical History  Procedure Laterality Date  . Total hip arthroplasty      bilateral  . Veins stripping    . Squamous cell carcinoma excision     Family History:  Family History  Problem Relation Age of Onset  . Diabetes Father   . Heart attack Father   . Alzheimer's disease Mother   . Cancer - Ovarian Mother   . Leukemia Mother   . Breast cancer Sister   . Anxiety disorder Sister   . Depression Sister   . Headache Sister    Social History:  Social History   Social History  . Marital Status: Married    Spouse Name: N/A  . Number of Children: N/A  . Years of Education: N/A   Occupational History  . Research scientist (life sciences)    Social History Main Topics  . Smoking status: Former Smoker -- 0.50 packs/day    Types: Cigarettes, Cigars    Quit date: 01/29/1988  . Smokeless tobacco: Never Used     Comment: occasional cigar not had any in 3-4 years.  . Alcohol Use: 9.6 oz/week    0 Standard drinks or equivalent, 8 Glasses of wine, 8 Cans of beer, 0 Shots of liquor per week     Comment: 2-3 beers  nightly, occasional wine.  . Drug Use: No  . Sexual Activity: Yes    Birth Control/ Protection: None   Other Topics Concern  . None   Social History Narrative   Additional History:   Assessment:   Musculoskeletal: Strength & Muscle Tone: within normal limits Gait & Station: normal Patient leans: N/A  Psychiatric Specialty Exam: HPI  Review of Systems  Psychiatric/Behavioral: Negative for depression, suicidal ideas, hallucinations, memory loss and substance abuse. The patient has insomnia. The patient is not nervous/anxious.   All other systems reviewed and are negative.   Blood pressure 108/58, pulse 56, temperature 97.9 F (36.6 C), temperature source Tympanic, height 5\' 10"  (1.778 m), weight 230 lb 3.2 oz (104.418 kg), SpO2 97 %.Body mass index is 33.03 kg/(m^2).   General Appearance: Neat and Well Groomed  Eye Contact:  Good  Speech:  Normal Rate  Volume:  Normal  Mood:  good  Affect:  Congruent  Thought Process:  Linear and Logical  Orientation:  Full (Time, Place, and Person)  Thought Content:  Negative  Suicidal Thoughts:  No  Homicidal Thoughts:  No  Memory:  Immediate;   Good Recent;   Good Remote;   Good  Judgement:  Good  Insight:  Good  Psychomotor Activity:  Negative  Concentration:  Good  Recall:  Good  Fund of Knowledge: Good  Language: Good  Akathisia:  Negative  Handed:  Right unknown  AIMS (if indicated):  BN/A  Assets:  Communication Skills Desire for Improvement Social Support  AL's:  Intact  Cognition: WNL  Sleep:  fair   Is the patient at risk to self?  No. Has the patient been a risk to self in the past 6 months?  No. Has the patient been a risk to self within the distant past?  No. Is the patient a risk to others?  No. Has the patient been a risk to others in the past 6 months?  No. Has the patient been a risk to others within the distant past?  No.  Current Medications: Current Outpatient Prescriptions  Medication Sig Dispense Refill  . allopurinol (ZYLOPRIM) 100 MG tablet TK 1 T PO QD  10  . amLODipine (NORVASC) 2.5 MG tablet Take by mouth.    Marland Kitchen aspirin 325 MG tablet Take 325 mg by mouth daily.    . benztropine (COGENTIN) 0.5 MG tablet TK 1 T PO Q NIGHT  9  . bumetanide (BUMEX) 0.5 MG tablet daily.     . carbidopa-levodopa (SINEMET IR) 25-100 MG per tablet 7 tabs by mouth daily as directed    . Cyanocobalamin (RA VITAMIN B-12 TR) 1000 MCG TBCR Take by mouth.    . DHA-EPA-VITAMIN E PO Take by mouth.    . fluticasone (FLONASE) 50 MCG/ACT nasal spray Place into the nose.    Marland Kitchen lisinopril (PRINIVIL,ZESTRIL) 20 MG tablet Take 20 mg by mouth daily.    . Melatonin 10 MG TABS Take by mouth.    . memantine (NAMENDA) 5 MG tablet TAKE 1 TABLET BY MOUTH EVERY DAY    . Multiple Vitamin (MULTI-VITAMINS) TABS Take  by mouth.    Marland Kitchen MYRBETRIQ 50 MG TB24 tablet TK 1 T PO BID  5  . nadolol (CORGARD) 80 MG tablet 1 1/2 bid    . pantoprazole (PROTONIX) 40 MG tablet TK 1 T PO QD  0  . senna (SENOKOT) 8.6 MG TABS tablet Take by mouth.    . sertraline (ZOLOFT) 100 MG tablet TK 1  T PO QD  3  . tadalafil (CIALIS) 20 MG tablet TK 1 T PO QD PRF ERECTILE DYSFUNCTION    . hydrOXYzine (ATARAX/VISTARIL) 50 MG tablet Take one to two tablets at bedtime as needed for insomnia. 60 tablet 2   No current facility-administered medications for this visit.    Medical Decision Making:  Established Problem, Stable/Improving (1)  Treatment Plan Summary:Medication management and Plan   Major depressive disorder, his primary care 30 increase his sertraline to 100 mg daily and patient reports benefit from this. Thus she will continue at this dose.   Generalized anxiety disorder-sertraline as above.  Insomnia-primary care took him off his to temazepam Pam and patient has already discontinued the trial of lorazepam. We will start some Vistaril 50-100 mg at bedtime as needed for insomnia.  Patient will follow up in 3 months. He is encouraged calling questions concerns prior to his next appointment.   Faith Rogue 03/25/2015, 11:00 AM

## 2015-06-03 NOTE — Progress Notes (Signed)
Pharmacy notified.

## 2015-06-25 ENCOUNTER — Encounter: Payer: Self-pay | Admitting: Psychiatry

## 2015-06-25 ENCOUNTER — Ambulatory Visit (INDEPENDENT_AMBULATORY_CARE_PROVIDER_SITE_OTHER): Payer: No Typology Code available for payment source | Admitting: Psychiatry

## 2015-06-25 VITALS — BP 108/68 | HR 60 | Temp 97.8°F | Ht 70.0 in | Wt 228.6 lb

## 2015-06-25 DIAGNOSIS — G4700 Insomnia, unspecified: Secondary | ICD-10-CM

## 2015-06-25 DIAGNOSIS — F411 Generalized anxiety disorder: Secondary | ICD-10-CM

## 2015-06-25 DIAGNOSIS — F321 Major depressive disorder, single episode, moderate: Secondary | ICD-10-CM

## 2015-06-25 MED ORDER — RAMELTEON 8 MG PO TABS: 8.0000 mg | ORAL_TABLET | Freq: Every day | ORAL | Status: DC

## 2015-06-25 MED ORDER — SERTRALINE HCL 100 MG PO TABS: 100.0000 mg | ORAL_TABLET | Freq: Every day | ORAL | Status: AC

## 2015-06-25 NOTE — Progress Notes (Signed)
[facility] MD/PA/NP OP Progress Note  06/25/2015 11:37 AM Christian Anderson.  MRN:  [id number]  Subjective:  Ivin Booty returns her follow-up was major depressive disorder and generalized anxiety disorder. Patient indicates that some things have returned. He states his hallucinations have returned. He clarifies that this is when he can look at a certain part of the room and feel like something vague has moved by. He states is no fitted order prescription long-standing hallucination but just something that for a fleeting minute appears like it is moving. He states he continues of difficulty sleeping and his wife indicates that he can go several days and then have a dream that he's acting out at night. She states it is not every night. If and patient are to state his depression is under good control.  Patient also states his tremor has returned. Patient and wife brought up whether they should see a neuropsychiatrist and I stated that might be a reasonable thing to do as they might see patients with similar conditions as his (i.e. Parkinson's) and comorbid psychiatric problems.  Patient states that sleep continues to be an issue. He does have to take a nap during the day and he notes that when he does not do it he will also sleep during activities. Wife also states that there was a day in which they shoveled snow and he was very active but yet he still had trouble sleeping at night. Whether they've had an updated sleep study given that he's having continued issues with sleep that have not responded to a lot of medications and already has a diagnosis of sleep apnea. Chief Complaint:  Chief Complaint    Follow-up; Medication Refill     Visit Diagnosis:     ICD-9-CM ICD-10-CM   1. Insomnia 780.52 G47.00   2. Major depressive disorder, single episode, moderate (HCC) 296.22 F32.1   3. GAD (generalized anxiety disorder) 300.02 F41.1     Past Medical History:  Past Medical History  Diagnosis Date  . Tremor   .  Hypertension   . Hyperlipemia   . Anxiety   . Depression     Past Surgical History  Procedure Laterality Date  . Total hip arthroplasty      bilateral  . Veins stripping    . Squamous cell carcinoma excision     Family History:  Family History  Problem Relation Age of Onset  . Diabetes Father   . Heart attack Father   . Alzheimer's disease Mother   . Cancer - Ovarian Mother   . Leukemia Mother   . Breast cancer Sister   . Anxiety disorder Sister   . Depression Sister   . Headache Sister    Social History:  Social History   Social History  . Marital Status: Married    Spouse Name: N/A  . Number of Children: N/A  . Years of Education: N/A   Occupational History  . Research scientist (life sciences)    Social History Main Topics  . Smoking status: Former Smoker -- 0.50 packs/day    Types: Cigarettes, Cigars    Quit date: 01/29/1988  . Smokeless tobacco: Never Used     Comment: occasional cigar not had any in 3-4 years.  . Alcohol Use: 9.6 oz/week    0 Standard drinks or equivalent, 8 Glasses of wine, 8 Cans of beer, 0 Shots of liquor per week     Comment: 2-3 beers nightly, occasional wine.  . Drug Use: No  . Sexual Activity: Yes  Birth Control/ Protection: None   Other Topics Concern  . None   Social History Narrative   Additional History:   Assessment:   Musculoskeletal: Strength & Muscle Tone: within normal limits Gait & Station: normal Patient leans: N/A  Psychiatric Specialty Exam: HPI  Review of Systems  Psychiatric/Behavioral: Negative for depression, suicidal ideas, hallucinations, memory loss and substance abuse. The patient has insomnia. The patient is not nervous/anxious.   All other systems reviewed and are negative.   Blood pressure 108/68, pulse 60, temperature 97.8 F (36.6 C), temperature source Tympanic, height 5\' 10"  (1.778 m), weight 228 lb 9.6 oz (103.692 kg), SpO2 97 %.Body mass index is 32.8 kg/(m^2).  General Appearance: Neat and Well  Groomed  Eye Contact:  Good  Speech:  Normal Rate  Volume:  Normal  Mood:  good  Affect:  Congruent, patient is able to joke   Thought Process:  Linear and Logical  Orientation:  Full (Time, Place, and Person)  Thought Content:  Negative  Suicidal Thoughts:  No  Homicidal Thoughts:  No  Memory:  Immediate;   Good Recent;   Good Remote;   Good  Judgement:  Good  Insight:  Good  Psychomotor Activity:  Negative  Concentration:  Good  Recall:  Good  Fund of Knowledge: Good  Language: Good  Akathisia:  Negative  Handed:  Right unknown  AIMS (if indicated):  BN/A  Assets:  Communication Skills Desire for Improvement Social Support  AL's:  Intact  Cognition: WNL  Sleep:  fair   Is the patient at risk to self?  No. Has the patient been a risk to self in the past 6 months?  No. Has the patient been a risk to self within the distant past?  No. Is the patient a risk to others?  No. Has the patient been a risk to others in the past 6 months?  No. Has the patient been a risk to others within the distant past?  No.  Current Medications: Current Outpatient Prescriptions  Medication Sig Dispense Refill  . allopurinol (ZYLOPRIM) 100 MG tablet TK 1 T PO QD  10  . amLODipine (NORVASC) 2.5 MG tablet Take by mouth.    Marland Kitchen aspirin 325 MG tablet Take 325 mg by mouth daily.    . benztropine (COGENTIN) 0.5 MG tablet TK 1 T PO Q NIGHT  9  . bumetanide (BUMEX) 0.5 MG tablet daily.     . carbidopa-levodopa (SINEMET IR) 25-100 MG per tablet 7 tabs by mouth daily as directed    . Cyanocobalamin (RA VITAMIN B-12 TR) 1000 MCG TBCR Take by mouth.    . DHA-EPA-VITAMIN E PO Take by mouth.    . fluticasone (FLONASE) 50 MCG/ACT nasal spray Place into the nose.    . hydrOXYzine (ATARAX/VISTARIL) 50 MG tablet Take one to two tablets at bedtime as needed for insomnia. 60 tablet 2  . lisinopril (PRINIVIL,ZESTRIL) 20 MG tablet Take 20 mg by mouth daily.    . Melatonin 10 MG TABS Take by mouth.    .  memantine (NAMENDA) 5 MG tablet TAKE 1 TABLET BY MOUTH EVERY DAY    . Multiple Vitamin (MULTI-VITAMINS) TABS Take by mouth.    Marland Kitchen MYRBETRIQ 50 MG TB24 tablet TK 1 T PO BID  5  . nadolol (CORGARD) 80 MG tablet 1 1/2 bid    . pantoprazole (PROTONIX) 40 MG tablet TK 1 T PO QD  0  . senna (SENOKOT) 8.6 MG TABS tablet Take by mouth.    Marland Kitchen  sertraline (ZOLOFT) 100 MG tablet Take 1 tablet (100 mg total) by mouth daily. 30 tablet 4  . tadalafil (CIALIS) 20 MG tablet TK 1 T PO QD PRF ERECTILE DYSFUNCTION    . ramelteon (ROZEREM) 8 MG tablet Take 1 tablet (8 mg total) by mouth at bedtime. 30 tablet 4   No current facility-administered medications for this visit.    Medical Decision Making:  Established Problem, Stable/Improving (1)  Treatment Plan Summary:Medication management and Plan   Major depressive disorder, his primary care 30 increase his sertraline to 100 mg daily and patient reports benefit from this. Thus she will continue at this dose.   Generalized anxiety disorder-sertraline as above.  Insomnia-patient states the Vistaril was not helpful. We will start some Rozerem 8 mg at bedtime. Risk and benefits of been discussing patient's able to consent.  Patient will follow up in 3 weeks or so to assess the response to Rozerem. However patient wife indicated that should this medication work they may call and cancel the appointment if there are no issues. He is encouraged calling questions concerns prior to his next appointment. Her aware that I am leaving the practice and that they will be following up with another provider after they see me in 3 weeks.   Faith Rogue 06/25/2015, 11:37 AM

## 2015-07-16 ENCOUNTER — Ambulatory Visit: Payer: No Typology Code available for payment source | Admitting: Psychiatry

## 2015-08-26 DIAGNOSIS — R413 Other amnesia: Secondary | ICD-10-CM | POA: Insufficient documentation

## 2015-10-21 DIAGNOSIS — Z006 Encounter for examination for normal comparison and control in clinical research program: Secondary | ICD-10-CM | POA: Insufficient documentation

## 2015-10-22 ENCOUNTER — Ambulatory Visit (INDEPENDENT_AMBULATORY_CARE_PROVIDER_SITE_OTHER): Payer: BLUE CROSS/BLUE SHIELD | Admitting: Urology

## 2015-10-22 ENCOUNTER — Other Ambulatory Visit: Payer: Self-pay

## 2015-10-22 ENCOUNTER — Encounter: Payer: Self-pay | Admitting: Urology

## 2015-10-22 VITALS — BP 130/73 | HR 50 | Ht 70.0 in | Wt 225.1 lb

## 2015-10-22 DIAGNOSIS — R972 Elevated prostate specific antigen [PSA]: Secondary | ICD-10-CM | POA: Diagnosis not present

## 2015-10-22 DIAGNOSIS — R350 Frequency of micturition: Secondary | ICD-10-CM | POA: Diagnosis not present

## 2015-10-22 LAB — URINALYSIS, COMPLETE
Bilirubin, UA: NEGATIVE
GLUCOSE, UA: NEGATIVE
LEUKOCYTES UA: NEGATIVE
Nitrite, UA: NEGATIVE
Protein, UA: NEGATIVE
RBC, UA: NEGATIVE
Specific Gravity, UA: 1.015 (ref 1.005–1.030)
UUROB: 0.2 mg/dL (ref 0.2–1.0)
pH, UA: 7.5 (ref 5.0–7.5)

## 2015-10-22 LAB — MICROSCOPIC EXAMINATION
BACTERIA UA: NONE SEEN
WBC, UA: NONE SEEN /hpf (ref 0–?)

## 2015-10-22 LAB — BLADDER SCAN AMB NON-IMAGING: SCAN RESULT: 50

## 2015-10-22 MED ORDER — MIRABEGRON ER 50 MG PO TB24: 50.0000 mg | ORAL_TABLET | Freq: Every day | ORAL | Status: DC

## 2015-10-22 NOTE — Progress Notes (Signed)
10/22/2015 10:38 AM   Christian Anderson. 1951-01-04 [id number]  Referring provider: Idelle Crouch, MD [place] [facility] [place], [place] [address]  Chief Complaint  Patient presents with  . New Patient (Initial Visit)    elevated PSA    HPI: The patient is a 65 year old gentleman who presents for evaluation of an elevated PSA.  1. Elevated PSA It was 6.07 in April 2017. It was 3.86 two years prior. He does have Parkinson's disease and a fair amount of other medical problem.  2. Urinary frequency/urgency Patient has urinary frequency and urinary urgency. He's been on Bactrim 50 mg in the past. This worked very well for him. He was described as a twice a day medication 25 mg reach dose. Insurance Would not pay for because he was getting an separate doses. He will that medication and worked well for his urinary urgency. He is currently on trospium but he he does not feel it works as well for him. It is also concerning that he is on this medication as he has a history of Parkinson's disease and there is a chance it could be affecting his mental status. His base complaint analysis urinary urgency.   PMH: Past Medical History  Diagnosis Date  . Tremor   . Hypertension   . Hyperlipemia   . Anxiety   . Depression     Surgical History: Past Surgical History  Procedure Laterality Date  . Total hip arthroplasty      bilateral  . Veins stripping    . Squamous cell carcinoma excision      Home Medications:    Medication List       This list is accurate as of: 10/22/15 10:38 AM.  Always use your most recent med list.               allopurinol 100 MG tablet  Commonly known as:  ZYLOPRIM  TK 1 T PO QD     aspirin 325 MG tablet  Take 325 mg by mouth daily.     benztropine 0.5 MG tablet  Commonly known as:  COGENTIN  TK 1 T PO Q NIGHT     bumetanide 1 MG tablet  Commonly known as:  BUMEX  TK 1 T PO QD     carbidopa-levodopa 25-250 MG  tablet  Commonly known as:  SINEMET IR  TK 1 T PO QID     CIALIS 20 MG tablet  Generic drug:  tadalafil  Reported on 10/22/2015     DHA-EPA-VITAMIN E PO  Take by mouth.     donepezil 5 MG tablet  Commonly known as:  ARICEPT  Take by mouth.     FISH OIL PO  Take by mouth.     fluticasone 50 MCG/ACT nasal spray  Commonly known as:  FLONASE  Place into the nose.     haloperidol 0.5 MG tablet  Commonly known as:  HALDOL  Reported on 10/22/2015     hydrOXYzine 50 MG tablet  Commonly known as:  ATARAX/VISTARIL  Take one to two tablets at bedtime as needed for insomnia.     lisinopril 20 MG tablet  Commonly known as:  PRINIVIL,ZESTRIL  Take 20 mg by mouth daily.     Melatonin 10 MG Tabs  Take by mouth.     meloxicam 7.5 MG tablet  Commonly known as:  MOBIC  Reported on 10/22/2015     memantine 5 MG tablet  Commonly known as:  NAMENDA  Reported on 10/22/2015     MULTI-VITAMINS Tabs  Take by mouth.     MYRBETRIQ 50 MG Tb24 tablet  Generic drug:  mirabegron ER  Reported on 10/22/2015     mirabegron ER 50 MG Tb24 tablet  Commonly known as:  MYRBETRIQ  Take 1 tablet (50 mg total) by mouth daily.     nadolol 80 MG tablet  Commonly known as:  CORGARD  1 1/2 bid     NORVASC 2.5 MG tablet  Generic drug:  amLODipine  Take by mouth.     pantoprazole 40 MG tablet  Commonly known as:  PROTONIX  TK 1 T PO QD     RA VITAMIN B-12 TR 1000 MCG Tbcr  Generic drug:  Cyanocobalamin  Take by mouth.     ramelteon 8 MG tablet  Commonly known as:  ROZEREM  Take 1 tablet (8 mg total) by mouth at bedtime.     senna 8.6 MG Tabs tablet  Commonly known as:  SENOKOT  Take by mouth.     sertraline 100 MG tablet  Commonly known as:  ZOLOFT  Take 1 tablet (100 mg total) by mouth daily.     trospium 20 MG tablet  Commonly known as:  SANCTURA  TK 1 T PO BID        Allergies: No Known Allergies  Family History: Family History  Problem Relation Age of Onset  . Diabetes  Father   . Heart attack Father   . Alzheimer's disease Mother   . Cancer - Ovarian Mother   . Leukemia Mother   . Breast cancer Sister   . Anxiety disorder Sister   . Depression Sister   . Headache Sister     Social History:  reports that he quit smoking about 27 years ago. His smoking use included Cigarettes and Cigars. He smoked 0.50 packs per day. He has never used smokeless tobacco. He reports that he drinks about 9.6 oz of alcohol per week. He reports that he does not use illicit drugs.  ROS: UROLOGY Frequent Urination?: Yes Hard to postpone urination?: Yes Burning/pain with urination?: No Get up at night to urinate?: Yes Leakage of urine?: Yes Urine stream starts and stops?: Yes Trouble starting stream?: No Do you have to strain to urinate?: No Blood in urine?: Yes Urinary tract infection?: No Sexually transmitted disease?: No Injury to kidneys or bladder?: No Painful intercourse?: No Weak stream?: No Erection problems?: Yes Penile pain?: No  Gastrointestinal Nausea?: No Vomiting?: No Indigestion/heartburn?: No Diarrhea?: No Constipation?: No  Constitutional Fever: No Night sweats?: No Weight loss?: Yes Fatigue?: Yes  Skin Skin rash/lesions?: No Itching?: No  Eyes Blurred vision?: No Double vision?: No  Ears/Nose/Throat Sore throat?: No Sinus problems?: No  Hematologic/Lymphatic Swollen glands?: No Easy bruising?: No  Cardiovascular Leg swelling?: No Chest pain?: No  Respiratory Cough?: No Shortness of breath?: No  Endocrine Excessive thirst?: No  Musculoskeletal Back pain?: Yes Joint pain?: Yes  Neurological Headaches?: No Dizziness?: Yes  Psychologic Depression?: Yes Anxiety?: Yes  Physical Exam: BP 130/73 mmHg  Pulse 50  Ht 5\' 10"  (1.778 m)  Wt 225 lb 1.6 oz (102.105 kg)  BMI 32.30 kg/m2  Constitutional:  Alert and oriented, No acute distress. HEENT: [place] AT, moist mucus membranes.  Trachea midline, no  masses. Cardiovascular: No clubbing, cyanosis, or edema. Respiratory: Normal respiratory effort, no increased work of breathing. GI: Abdomen is soft, nontender, nondistended, no abdominal masses GU: No CVA tenderness. Normal phallus.  Testicles descended equally bilaterally. DRE: 2+ smooth no nodules. Skin: No rashes, bruises or suspicious lesions. Lymph: No cervical or inguinal adenopathy. Neurologic: Grossly intact, no focal deficits, moving all 4 extremities. Psychiatric: Normal mood and affect.  Laboratory Data: Lab Results  Component Value Date   WBC 7.8 10/12/2013   HGB 15.4 10/12/2013   HCT 48.3 10/12/2013   MCV 93 10/12/2013   PLT 243 10/12/2013    Lab Results  Component Value Date   CREATININE 1.03 10/12/2013    No results found for: PSA  No results found for: TESTOSTERONE  No results found for: HGBA1C  Urinalysis    Component Value Date/Time   COLORURINE Yellow 10/07/2013 1333   APPEARANCEUR Clear 10/07/2013 1333   LABSPEC 1.012 10/07/2013 1333   PHURINE 7.0 10/07/2013 1333   GLUCOSEU Negative 10/07/2013 1333   HGBUR Negative 10/07/2013 1333   BILIRUBINUR Negative 10/07/2013 1333   KETONESUR Negative 10/07/2013 1333   PROTEINUR Negative 10/07/2013 1333   NITRITE Negative 10/07/2013 1333   LEUKOCYTESUR Negative 10/07/2013 1333      Assessment & Plan:   1. Elevated PSA I had about a 25 minute conversation the patient regarding his elevated PSA. He understands the next step would be rechecking his PSA to confirm true elevation followed by prostate biopsy was discussed this procedure great detail as well as the natural history of prostate cancer. We did frankly discussed the fact that his Parkinson's disease with the patient is unsure of his life expectancy. He is not sure if he wants undergo a biopsy at this time. He'll follow-up in 3 months with a PSA 1 week prior. We will discuss this in greater detail at that time.  2. Urinary frequency/urgency We will  start the patient back on Myrbetriq if the milligrams daily. He was given samples for this. His medication worked well for him in the past. We will try to fill out insurance authorizations if that becomes an issue. We'll stop his trospium. Not only did this medication not work for him, he is at risk for mental status changes especially given his history of Parkinson's disease. We will evaluate his progress in three months.   Return in about 3 months (around 01/22/2016) for with PSA one week prior.  Nickie Retort, MD  [facility] [address], [place], [place] [address] [phone]

## 2015-10-27 ENCOUNTER — Other Ambulatory Visit: Payer: Self-pay

## 2015-10-27 DIAGNOSIS — N3281 Overactive bladder: Secondary | ICD-10-CM

## 2015-10-27 MED ORDER — MIRABEGRON ER 50 MG PO TB24: 50.0000 mg | ORAL_TABLET | Freq: Every day | ORAL | Status: DC

## 2015-11-03 ENCOUNTER — Ambulatory Visit: Payer: BLUE CROSS/BLUE SHIELD | Attending: Specialist

## 2015-11-03 DIAGNOSIS — G4733 Obstructive sleep apnea (adult) (pediatric): Secondary | ICD-10-CM | POA: Diagnosis present

## 2015-12-24 ENCOUNTER — Ambulatory Visit: Payer: BLUE CROSS/BLUE SHIELD | Attending: Specialist

## 2015-12-24 DIAGNOSIS — G4733 Obstructive sleep apnea (adult) (pediatric): Secondary | ICD-10-CM | POA: Diagnosis not present

## 2016-01-07 ENCOUNTER — Other Ambulatory Visit: Payer: BLUE CROSS/BLUE SHIELD

## 2016-01-07 DIAGNOSIS — R972 Elevated prostate specific antigen [PSA]: Secondary | ICD-10-CM

## 2016-01-08 LAB — PSA TOTAL (REFLEX TO FREE): Prostate Specific Ag, Serum: 3.2 ng/mL (ref 0.0–4.0)

## 2016-01-14 ENCOUNTER — Ambulatory Visit (INDEPENDENT_AMBULATORY_CARE_PROVIDER_SITE_OTHER): Payer: BLUE CROSS/BLUE SHIELD | Admitting: Urology

## 2016-01-14 VITALS — BP 98/60 | HR 65 | Ht 70.0 in | Wt 222.0 lb

## 2016-01-14 DIAGNOSIS — R350 Frequency of micturition: Secondary | ICD-10-CM

## 2016-01-14 DIAGNOSIS — R972 Elevated prostate specific antigen [PSA]: Secondary | ICD-10-CM

## 2016-01-14 MED ORDER — MIRABEGRON ER 50 MG PO TB24: 50.0000 mg | ORAL_TABLET | Freq: Every day | ORAL | 3 refills | Status: DC

## 2016-01-14 NOTE — Progress Notes (Signed)
01/14/2016 11:29 AM   Christian Anderson. 1951/04/25 [id number]  Referring provider: Idelle Crouch, MD [facility] [place], [place] [address]  Chief Complaint  Patient presents with  . Elevated PSA    79month    HPI: The patient is a 64 year old gentleman who presents for evaluation of an elevated PSA.  1. Elevated PSA It was 6.07 in April 2017. It was 3.86 two years prior. He does have Parkinson's disease and a fair amount of other medical problem.At his last visit he was hesitant to undergo a prostate biopsy repeat PSA. He returns today with a repeat PSA in August 2017 of 3.2.  2. Urinary frequency/urgency Currenlty on Myrbetriq  50 mg daily.His urgency and urinary frequency is completely resolved on this medication. He is very happy with this medication.   PMH: Past Medical History:  Diagnosis Date  . Anxiety   . Depression   . Hyperlipemia   . Hypertension   . Tremor     Surgical History: Past Surgical History:  Procedure Laterality Date  . SQUAMOUS CELL CARCINOMA EXCISION    . TOTAL HIP ARTHROPLASTY     bilateral  . veins stripping      Home Medications:    Medication List       Accurate as of 01/14/16 11:29 AM. Always use your most recent med list.          allopurinol 100 MG tablet Commonly known as:  ZYLOPRIM TK 1 T PO QD   aspirin 325 MG tablet Take 325 mg by mouth daily.   benztropine 0.5 MG tablet Commonly known as:  COGENTIN TK 1 T PO Q NIGHT   bumetanide 1 MG tablet Commonly known as:  BUMEX TK 1 T PO QD   carbidopa-levodopa 25-250 MG tablet Commonly known as:  SINEMET IR TK 1 T PO QID   CIALIS 20 MG tablet Generic drug:  tadalafil Reported on 10/22/2015   donepezil 5 MG tablet Commonly known as:  ARICEPT Take by mouth.   FISH OIL PO Take by mouth.   lisinopril 20 MG tablet Commonly known as:  PRINIVIL,ZESTRIL Take 20 mg by mouth daily.   Melatonin 10 MG Tabs Take by mouth.     meloxicam 7.5 MG tablet Commonly known as:  MOBIC Reported on 10/22/2015   memantine 5 MG tablet Commonly known as:  NAMENDA Reported on 10/22/2015   MULTI-VITAMINS Tabs Take by mouth.   MYRBETRIQ 50 MG Tb24 tablet Generic drug:  mirabegron ER Reported on 10/22/2015   mirabegron ER 50 MG Tb24 tablet Commonly known as:  MYRBETRIQ Take 1 tablet (50 mg total) by mouth daily.   mirabegron ER 50 MG Tb24 tablet Commonly known as:  MYRBETRIQ Take 1 tablet (50 mg total) by mouth daily.   nadolol 80 MG tablet Commonly known as:  CORGARD 1 1/2 bid   pantoprazole 40 MG tablet Commonly known as:  PROTONIX TK 1 T PO QD   RA VITAMIN B-12 TR 1000 MCG Tbcr Generic drug:  Cyanocobalamin Take by mouth.   senna 8.6 MG Tabs tablet Commonly known as:  SENOKOT Take by mouth.   sertraline 100 MG tablet Commonly known as:  ZOLOFT Take 1 tablet (100 mg total) by mouth daily.       Allergies: No Known Allergies  Family History: Family History  Problem Relation Age of Onset  . Diabetes Father   . Heart attack Father   . Alzheimer's disease Mother   .  Cancer - Ovarian Mother   . Leukemia Mother   . Breast cancer Sister   . Anxiety disorder Sister   . Depression Sister   . Headache Sister     Social History:  reports that he quit smoking about 27 years ago. His smoking use included Cigarettes and Cigars. He smoked 0.50 packs per day. He has never used smokeless tobacco. He reports that he drinks about 9.6 oz of alcohol per week . He reports that he does not use drugs.  ROS: UROLOGY Frequent Urination?: No Hard to postpone urination?: No Burning/pain with urination?: No Get up at night to urinate?: No Leakage of urine?: No Urine stream starts and stops?: No Trouble starting stream?: No Do you have to strain to urinate?: No Blood in urine?: No Urinary tract infection?: No Sexually transmitted disease?: No Injury to kidneys or bladder?: No Painful intercourse?: No Weak  stream?: No Erection problems?: Yes Penile pain?: No  Gastrointestinal Nausea?: No Vomiting?: No Indigestion/heartburn?: No Diarrhea?: No Constipation?: No  Constitutional Fever: No Night sweats?: No Weight loss?: No Fatigue?: Yes  Skin Skin rash/lesions?: No Itching?: No  Eyes Blurred vision?: No Double vision?: No  Ears/Nose/Throat Sore throat?: No Sinus problems?: No  Hematologic/Lymphatic Swollen glands?: No Easy bruising?: No  Cardiovascular Leg swelling?: No Chest pain?: No  Respiratory Cough?: No Shortness of breath?: No  Endocrine Excessive thirst?: No  Musculoskeletal Back pain?: Yes Joint pain?: Yes  Neurological Headaches?: No Dizziness?: Yes  Psychologic Depression?: Yes Anxiety?: Yes  Physical Exam: BP 98/60   Pulse 65   Ht 5\' 10"  (1.778 m)   Wt 222 lb (100.7 kg)   BMI 31.85 kg/m   Constitutional:  Alert and oriented, No acute distress. HEENT: [place] AT, moist mucus membranes.  Trachea midline, no masses. Cardiovascular: No clubbing, cyanosis, or edema. Respiratory: Normal respiratory effort, no increased work of breathing. GI: Abdomen is soft, nontender, nondistended, no abdominal masses GU: No CVA tenderness.  Skin: No rashes, bruises or suspicious lesions. Lymph: No cervical or inguinal adenopathy. Neurologic: Grossly intact, no focal deficits, moving all 4 extremities. Psychiatric: Normal mood and affect.  Laboratory Data: Lab Results  Component Value Date   WBC 7.8 10/12/2013   HGB 15.4 10/12/2013   HCT 48.3 10/12/2013   MCV 93 10/12/2013   PLT 243 10/12/2013    Lab Results  Component Value Date   CREATININE 1.03 10/12/2013    No results found for: PSA  No results found for: TESTOSTERONE  No results found for: HGBA1C  Urinalysis    Component Value Date/Time   COLORURINE Yellow 10/07/2013 1333   APPEARANCEUR Clear 10/22/2015 0957   LABSPEC 1.012 10/07/2013 1333   PHURINE 7.0 10/07/2013 1333   GLUCOSEU  Negative 10/22/2015 0957   GLUCOSEU Negative 10/07/2013 1333   HGBUR Negative 10/07/2013 1333   BILIRUBINUR Negative 10/22/2015 0957   BILIRUBINUR Negative 10/07/2013 1333   KETONESUR Negative 10/07/2013 1333   PROTEINUR Negative 10/22/2015 0957   PROTEINUR Negative 10/07/2013 1333   NITRITE Negative 10/22/2015 0957   NITRITE Negative 10/07/2013 1333   LEUKOCYTESUR Negative 10/22/2015 0957   LEUKOCYTESUR Negative 10/07/2013 1333     Assessment & Plan:    1. History of elevated PSA PSA is returned to the normal range. He'll return to annual screening with PSA and DRE in one year.  2. Urinary frequency/urgency Continue Myrbetriq 50 mg daily  Return in about 1 year (around 01/13/2017) for psa prior.  Nickie Retort, MD  [facility]  [address] [place], [place] [address] [phone]

## 2016-01-15 ENCOUNTER — Other Ambulatory Visit: Payer: Self-pay

## 2016-01-22 ENCOUNTER — Ambulatory Visit: Payer: Self-pay

## 2016-04-26 DIAGNOSIS — R441 Visual hallucinations: Secondary | ICD-10-CM | POA: Diagnosis not present

## 2016-04-26 DIAGNOSIS — G20 Parkinson's disease: Secondary | ICD-10-CM | POA: Diagnosis not present

## 2016-04-26 DIAGNOSIS — G218 Other secondary parkinsonism: Secondary | ICD-10-CM | POA: Diagnosis not present

## 2016-05-04 DIAGNOSIS — E7800 Pure hypercholesterolemia, unspecified: Secondary | ICD-10-CM | POA: Diagnosis not present

## 2016-05-04 DIAGNOSIS — Z79899 Other long term (current) drug therapy: Secondary | ICD-10-CM | POA: Diagnosis not present

## 2016-05-04 DIAGNOSIS — I10 Essential (primary) hypertension: Secondary | ICD-10-CM | POA: Diagnosis not present

## 2016-05-09 DIAGNOSIS — F331 Major depressive disorder, recurrent, moderate: Secondary | ICD-10-CM | POA: Diagnosis not present

## 2016-05-09 DIAGNOSIS — F0280 Dementia in other diseases classified elsewhere without behavioral disturbance: Secondary | ICD-10-CM | POA: Diagnosis not present

## 2016-05-09 DIAGNOSIS — F411 Generalized anxiety disorder: Secondary | ICD-10-CM | POA: Diagnosis not present

## 2016-05-11 DIAGNOSIS — E6609 Other obesity due to excess calories: Secondary | ICD-10-CM | POA: Diagnosis not present

## 2016-05-11 DIAGNOSIS — Z6833 Body mass index (BMI) 33.0-33.9, adult: Secondary | ICD-10-CM | POA: Diagnosis not present

## 2016-05-11 DIAGNOSIS — E7800 Pure hypercholesterolemia, unspecified: Secondary | ICD-10-CM | POA: Diagnosis not present

## 2016-05-11 DIAGNOSIS — G20 Parkinson's disease: Secondary | ICD-10-CM | POA: Diagnosis not present

## 2016-05-11 DIAGNOSIS — G4733 Obstructive sleep apnea (adult) (pediatric): Secondary | ICD-10-CM | POA: Diagnosis not present

## 2016-05-11 DIAGNOSIS — Z9989 Dependence on other enabling machines and devices: Secondary | ICD-10-CM | POA: Diagnosis not present

## 2016-05-11 DIAGNOSIS — F411 Generalized anxiety disorder: Secondary | ICD-10-CM | POA: Diagnosis not present

## 2016-05-11 DIAGNOSIS — Z79899 Other long term (current) drug therapy: Secondary | ICD-10-CM | POA: Diagnosis not present

## 2016-05-11 DIAGNOSIS — I10 Essential (primary) hypertension: Secondary | ICD-10-CM | POA: Diagnosis not present

## 2016-05-23 ENCOUNTER — Telehealth: Payer: Self-pay

## 2016-05-23 DIAGNOSIS — N3281 Overactive bladder: Secondary | ICD-10-CM

## 2016-05-23 NOTE — Telephone Encounter (Signed)
Pt insurance will not cover myrbetriq. Insurance letter states they will cover vesicare, toleterodine, trospium, and oxybutynin. Please advise.

## 2016-05-25 MED ORDER — SOLIFENACIN SUCCINATE 5 MG PO TABS: 5.0000 mg | ORAL_TABLET | Freq: Every day | ORAL | 11 refills | Status: DC

## 2016-05-25 NOTE — Telephone Encounter (Signed)
LMOM-medication sent to pharmacy 

## 2016-05-27 NOTE — Telephone Encounter (Signed)
LMOM

## 2016-06-08 DIAGNOSIS — M545 Low back pain: Secondary | ICD-10-CM | POA: Diagnosis not present

## 2016-06-09 ENCOUNTER — Other Ambulatory Visit: Payer: Self-pay | Admitting: Physician Assistant

## 2016-06-09 DIAGNOSIS — M545 Low back pain: Secondary | ICD-10-CM

## 2016-06-15 ENCOUNTER — Ambulatory Visit: Admission: RE | Admit: 2016-06-15 | Payer: PPO | Source: Ambulatory Visit

## 2016-06-15 DIAGNOSIS — F331 Major depressive disorder, recurrent, moderate: Secondary | ICD-10-CM | POA: Diagnosis not present

## 2016-06-15 DIAGNOSIS — F0280 Dementia in other diseases classified elsewhere without behavioral disturbance: Secondary | ICD-10-CM | POA: Diagnosis not present

## 2016-06-15 DIAGNOSIS — F411 Generalized anxiety disorder: Secondary | ICD-10-CM | POA: Diagnosis not present

## 2016-06-23 ENCOUNTER — Ambulatory Visit: Admission: RE | Admit: 2016-06-23 | Payer: PPO | Source: Ambulatory Visit

## 2016-07-05 ENCOUNTER — Ambulatory Visit
Admission: RE | Admit: 2016-07-05 | Discharge: 2016-07-05 | Disposition: A | Discharge status: Home / Self Care | Payer: PPO | Source: Ambulatory Visit | Attending: Physician Assistant | Admitting: Physician Assistant

## 2016-07-05 DIAGNOSIS — M48061 Spinal stenosis, lumbar region without neurogenic claudication: Secondary | ICD-10-CM | POA: Diagnosis not present

## 2016-07-05 DIAGNOSIS — E882 Lipomatosis, not elsewhere classified: Secondary | ICD-10-CM | POA: Insufficient documentation

## 2016-07-05 DIAGNOSIS — M5126 Other intervertebral disc displacement, lumbar region: Secondary | ICD-10-CM | POA: Diagnosis not present

## 2016-07-05 DIAGNOSIS — M545 Low back pain: Secondary | ICD-10-CM

## 2016-07-05 DIAGNOSIS — M4316 Spondylolisthesis, lumbar region: Secondary | ICD-10-CM | POA: Insufficient documentation

## 2016-07-22 DIAGNOSIS — M47816 Spondylosis without myelopathy or radiculopathy, lumbar region: Secondary | ICD-10-CM | POA: Diagnosis not present

## 2016-08-02 DIAGNOSIS — L851 Acquired keratosis [keratoderma] palmaris et plantaris: Secondary | ICD-10-CM | POA: Diagnosis not present

## 2016-08-02 DIAGNOSIS — B351 Tinea unguium: Secondary | ICD-10-CM | POA: Diagnosis not present

## 2016-08-02 DIAGNOSIS — M216X9 Other acquired deformities of unspecified foot: Secondary | ICD-10-CM | POA: Diagnosis not present

## 2016-08-02 DIAGNOSIS — L600 Ingrowing nail: Secondary | ICD-10-CM | POA: Diagnosis not present

## 2016-08-04 DIAGNOSIS — Z79899 Other long term (current) drug therapy: Secondary | ICD-10-CM | POA: Diagnosis not present

## 2016-08-04 DIAGNOSIS — I10 Essential (primary) hypertension: Secondary | ICD-10-CM | POA: Diagnosis not present

## 2016-08-04 DIAGNOSIS — E7800 Pure hypercholesterolemia, unspecified: Secondary | ICD-10-CM | POA: Diagnosis not present

## 2016-08-11 DIAGNOSIS — E7800 Pure hypercholesterolemia, unspecified: Secondary | ICD-10-CM | POA: Diagnosis not present

## 2016-08-11 DIAGNOSIS — F321 Major depressive disorder, single episode, moderate: Secondary | ICD-10-CM | POA: Diagnosis not present

## 2016-08-11 DIAGNOSIS — G20 Parkinson's disease: Secondary | ICD-10-CM | POA: Diagnosis not present

## 2016-08-11 DIAGNOSIS — F411 Generalized anxiety disorder: Secondary | ICD-10-CM | POA: Diagnosis not present

## 2016-08-11 DIAGNOSIS — I10 Essential (primary) hypertension: Secondary | ICD-10-CM | POA: Diagnosis not present

## 2016-08-23 DIAGNOSIS — M545 Low back pain: Secondary | ICD-10-CM | POA: Diagnosis not present

## 2016-08-25 DIAGNOSIS — H2513 Age-related nuclear cataract, bilateral: Secondary | ICD-10-CM | POA: Diagnosis not present

## 2016-08-26 DIAGNOSIS — H8102 Meniere's disease, left ear: Secondary | ICD-10-CM | POA: Diagnosis not present

## 2016-08-26 DIAGNOSIS — H903 Sensorineural hearing loss, bilateral: Secondary | ICD-10-CM | POA: Diagnosis not present

## 2016-08-26 DIAGNOSIS — H6123 Impacted cerumen, bilateral: Secondary | ICD-10-CM | POA: Diagnosis not present

## 2016-09-15 DIAGNOSIS — R251 Tremor, unspecified: Secondary | ICD-10-CM | POA: Diagnosis not present

## 2016-09-15 DIAGNOSIS — I10 Essential (primary) hypertension: Secondary | ICD-10-CM | POA: Diagnosis not present

## 2016-09-15 DIAGNOSIS — G20 Parkinson's disease: Secondary | ICD-10-CM | POA: Diagnosis not present

## 2016-09-16 DIAGNOSIS — F411 Generalized anxiety disorder: Secondary | ICD-10-CM | POA: Diagnosis not present

## 2016-09-16 DIAGNOSIS — F0280 Dementia in other diseases classified elsewhere without behavioral disturbance: Secondary | ICD-10-CM | POA: Diagnosis not present

## 2016-09-16 DIAGNOSIS — F331 Major depressive disorder, recurrent, moderate: Secondary | ICD-10-CM | POA: Diagnosis not present

## 2016-09-19 DIAGNOSIS — R42 Dizziness and giddiness: Secondary | ICD-10-CM | POA: Diagnosis not present

## 2016-09-19 DIAGNOSIS — H8109 Meniere's disease, unspecified ear: Secondary | ICD-10-CM | POA: Diagnosis not present

## 2016-09-19 DIAGNOSIS — H9120 Sudden idiopathic hearing loss, unspecified ear: Secondary | ICD-10-CM | POA: Diagnosis not present

## 2016-09-20 DIAGNOSIS — G8929 Other chronic pain: Secondary | ICD-10-CM | POA: Diagnosis not present

## 2016-09-20 DIAGNOSIS — M545 Low back pain: Secondary | ICD-10-CM | POA: Diagnosis not present

## 2016-10-06 DIAGNOSIS — R42 Dizziness and giddiness: Secondary | ICD-10-CM | POA: Diagnosis not present

## 2016-10-06 DIAGNOSIS — H9121 Sudden idiopathic hearing loss, right ear: Secondary | ICD-10-CM | POA: Diagnosis not present

## 2016-10-20 DIAGNOSIS — E7800 Pure hypercholesterolemia, unspecified: Secondary | ICD-10-CM | POA: Diagnosis not present

## 2016-10-20 DIAGNOSIS — I10 Essential (primary) hypertension: Secondary | ICD-10-CM | POA: Diagnosis not present

## 2016-10-20 DIAGNOSIS — G20 Parkinson's disease: Secondary | ICD-10-CM | POA: Diagnosis not present

## 2016-10-21 DIAGNOSIS — F0280 Dementia in other diseases classified elsewhere without behavioral disturbance: Secondary | ICD-10-CM | POA: Diagnosis not present

## 2016-10-21 DIAGNOSIS — F331 Major depressive disorder, recurrent, moderate: Secondary | ICD-10-CM | POA: Diagnosis not present

## 2016-10-21 DIAGNOSIS — F411 Generalized anxiety disorder: Secondary | ICD-10-CM | POA: Diagnosis not present

## 2016-11-03 DIAGNOSIS — M47816 Spondylosis without myelopathy or radiculopathy, lumbar region: Secondary | ICD-10-CM | POA: Diagnosis not present

## 2016-11-17 DIAGNOSIS — R42 Dizziness and giddiness: Secondary | ICD-10-CM | POA: Diagnosis not present

## 2016-11-17 DIAGNOSIS — I10 Essential (primary) hypertension: Secondary | ICD-10-CM | POA: Diagnosis not present

## 2016-11-17 DIAGNOSIS — G20 Parkinson's disease: Secondary | ICD-10-CM | POA: Diagnosis not present

## 2016-11-28 DIAGNOSIS — F411 Generalized anxiety disorder: Secondary | ICD-10-CM | POA: Diagnosis not present

## 2016-11-28 DIAGNOSIS — F331 Major depressive disorder, recurrent, moderate: Secondary | ICD-10-CM | POA: Diagnosis not present

## 2016-11-28 DIAGNOSIS — F0280 Dementia in other diseases classified elsewhere without behavioral disturbance: Secondary | ICD-10-CM | POA: Diagnosis not present

## 2016-12-06 DIAGNOSIS — G8929 Other chronic pain: Secondary | ICD-10-CM | POA: Diagnosis not present

## 2016-12-06 DIAGNOSIS — M25551 Pain in right hip: Secondary | ICD-10-CM | POA: Diagnosis not present

## 2016-12-06 DIAGNOSIS — M545 Low back pain: Secondary | ICD-10-CM | POA: Diagnosis not present

## 2016-12-06 DIAGNOSIS — R42 Dizziness and giddiness: Secondary | ICD-10-CM | POA: Diagnosis not present

## 2016-12-13 DIAGNOSIS — G20 Parkinson's disease: Secondary | ICD-10-CM | POA: Diagnosis not present

## 2016-12-13 DIAGNOSIS — R441 Visual hallucinations: Secondary | ICD-10-CM | POA: Diagnosis not present

## 2016-12-13 DIAGNOSIS — R42 Dizziness and giddiness: Secondary | ICD-10-CM | POA: Diagnosis not present

## 2016-12-14 DIAGNOSIS — R262 Difficulty in walking, not elsewhere classified: Secondary | ICD-10-CM | POA: Diagnosis not present

## 2016-12-14 DIAGNOSIS — R2681 Unsteadiness on feet: Secondary | ICD-10-CM | POA: Diagnosis not present

## 2016-12-14 DIAGNOSIS — R293 Abnormal posture: Secondary | ICD-10-CM | POA: Diagnosis not present

## 2016-12-14 DIAGNOSIS — R296 Repeated falls: Secondary | ICD-10-CM | POA: Diagnosis not present

## 2016-12-14 DIAGNOSIS — G20 Parkinson's disease: Secondary | ICD-10-CM | POA: Diagnosis not present

## 2016-12-15 DIAGNOSIS — G8929 Other chronic pain: Secondary | ICD-10-CM | POA: Diagnosis not present

## 2016-12-15 DIAGNOSIS — M545 Low back pain: Secondary | ICD-10-CM | POA: Diagnosis not present

## 2016-12-16 DIAGNOSIS — I10 Essential (primary) hypertension: Secondary | ICD-10-CM | POA: Diagnosis not present

## 2016-12-16 DIAGNOSIS — F321 Major depressive disorder, single episode, moderate: Secondary | ICD-10-CM | POA: Diagnosis not present

## 2016-12-16 DIAGNOSIS — G20 Parkinson's disease: Secondary | ICD-10-CM | POA: Diagnosis not present

## 2016-12-20 ENCOUNTER — Ambulatory Visit: Payer: BLUE CROSS/BLUE SHIELD | Admitting: Speech Pathology

## 2016-12-21 ENCOUNTER — Other Ambulatory Visit: Payer: Self-pay | Admitting: Physician Assistant

## 2016-12-21 DIAGNOSIS — L650 Telogen effluvium: Secondary | ICD-10-CM | POA: Diagnosis not present

## 2016-12-21 DIAGNOSIS — G20 Parkinson's disease: Secondary | ICD-10-CM

## 2016-12-21 DIAGNOSIS — R1319 Other dysphagia: Secondary | ICD-10-CM

## 2016-12-26 DIAGNOSIS — G20 Parkinson's disease: Secondary | ICD-10-CM | POA: Diagnosis not present

## 2016-12-26 DIAGNOSIS — R296 Repeated falls: Secondary | ICD-10-CM | POA: Diagnosis not present

## 2016-12-26 DIAGNOSIS — R262 Difficulty in walking, not elsewhere classified: Secondary | ICD-10-CM | POA: Diagnosis not present

## 2016-12-26 DIAGNOSIS — R293 Abnormal posture: Secondary | ICD-10-CM | POA: Diagnosis not present

## 2016-12-26 DIAGNOSIS — R2681 Unsteadiness on feet: Secondary | ICD-10-CM | POA: Diagnosis not present

## 2016-12-28 DIAGNOSIS — R2681 Unsteadiness on feet: Secondary | ICD-10-CM | POA: Diagnosis not present

## 2016-12-28 DIAGNOSIS — R296 Repeated falls: Secondary | ICD-10-CM | POA: Diagnosis not present

## 2016-12-28 DIAGNOSIS — R262 Difficulty in walking, not elsewhere classified: Secondary | ICD-10-CM | POA: Diagnosis not present

## 2016-12-28 DIAGNOSIS — G20 Parkinson's disease: Secondary | ICD-10-CM | POA: Diagnosis not present

## 2016-12-28 DIAGNOSIS — R293 Abnormal posture: Secondary | ICD-10-CM | POA: Diagnosis not present

## 2016-12-30 DIAGNOSIS — F411 Generalized anxiety disorder: Secondary | ICD-10-CM | POA: Diagnosis not present

## 2016-12-30 DIAGNOSIS — R296 Repeated falls: Secondary | ICD-10-CM | POA: Diagnosis not present

## 2016-12-30 DIAGNOSIS — R262 Difficulty in walking, not elsewhere classified: Secondary | ICD-10-CM | POA: Diagnosis not present

## 2016-12-30 DIAGNOSIS — G20 Parkinson's disease: Secondary | ICD-10-CM | POA: Diagnosis not present

## 2016-12-30 DIAGNOSIS — R2681 Unsteadiness on feet: Secondary | ICD-10-CM | POA: Diagnosis not present

## 2016-12-30 DIAGNOSIS — R293 Abnormal posture: Secondary | ICD-10-CM | POA: Diagnosis not present

## 2016-12-30 DIAGNOSIS — F331 Major depressive disorder, recurrent, moderate: Secondary | ICD-10-CM | POA: Diagnosis not present

## 2016-12-30 DIAGNOSIS — F0280 Dementia in other diseases classified elsewhere without behavioral disturbance: Secondary | ICD-10-CM | POA: Diagnosis not present

## 2017-01-02 DIAGNOSIS — G20 Parkinson's disease: Secondary | ICD-10-CM | POA: Diagnosis not present

## 2017-01-02 DIAGNOSIS — R262 Difficulty in walking, not elsewhere classified: Secondary | ICD-10-CM | POA: Diagnosis not present

## 2017-01-02 DIAGNOSIS — R296 Repeated falls: Secondary | ICD-10-CM | POA: Diagnosis not present

## 2017-01-02 DIAGNOSIS — R293 Abnormal posture: Secondary | ICD-10-CM | POA: Diagnosis not present

## 2017-01-02 DIAGNOSIS — R2681 Unsteadiness on feet: Secondary | ICD-10-CM | POA: Diagnosis not present

## 2017-01-04 ENCOUNTER — Ambulatory Visit: Payer: BLUE CROSS/BLUE SHIELD

## 2017-01-04 DIAGNOSIS — G20 Parkinson's disease: Secondary | ICD-10-CM | POA: Diagnosis not present

## 2017-01-04 DIAGNOSIS — R262 Difficulty in walking, not elsewhere classified: Secondary | ICD-10-CM | POA: Diagnosis not present

## 2017-01-04 DIAGNOSIS — R2681 Unsteadiness on feet: Secondary | ICD-10-CM | POA: Diagnosis not present

## 2017-01-04 DIAGNOSIS — R296 Repeated falls: Secondary | ICD-10-CM | POA: Diagnosis not present

## 2017-01-04 DIAGNOSIS — R293 Abnormal posture: Secondary | ICD-10-CM | POA: Diagnosis not present

## 2017-01-05 DIAGNOSIS — R296 Repeated falls: Secondary | ICD-10-CM | POA: Diagnosis not present

## 2017-01-05 DIAGNOSIS — R262 Difficulty in walking, not elsewhere classified: Secondary | ICD-10-CM | POA: Diagnosis not present

## 2017-01-05 DIAGNOSIS — R2681 Unsteadiness on feet: Secondary | ICD-10-CM | POA: Diagnosis not present

## 2017-01-05 DIAGNOSIS — G20 Parkinson's disease: Secondary | ICD-10-CM | POA: Diagnosis not present

## 2017-01-05 DIAGNOSIS — R293 Abnormal posture: Secondary | ICD-10-CM | POA: Diagnosis not present

## 2017-01-06 ENCOUNTER — Telehealth: Payer: Self-pay | Admitting: Urology

## 2017-01-06 ENCOUNTER — Other Ambulatory Visit: Payer: PPO

## 2017-01-06 DIAGNOSIS — R972 Elevated prostate specific antigen [PSA]: Secondary | ICD-10-CM

## 2017-01-06 NOTE — Telephone Encounter (Signed)
Pt called office LMOVM stating that Dr. Pilar Jarvis stated that he would forward his PSA results to Dr. Doy Hutching, pt is worried that this may not happen (fall through the cracks so to speak)  Pt wants to ensure Dr. Doy Hutching gets these results. Please be sure to forward his PSA results from today's lab draw to Dr. Doy Hutching. Thanks.

## 2017-01-07 LAB — PSA TOTAL (REFLEX TO FREE): Prostate Specific Ag, Serum: 3 ng/mL (ref 0.0–4.0)

## 2017-01-09 DIAGNOSIS — G20 Parkinson's disease: Secondary | ICD-10-CM | POA: Diagnosis not present

## 2017-01-09 DIAGNOSIS — R2681 Unsteadiness on feet: Secondary | ICD-10-CM | POA: Diagnosis not present

## 2017-01-09 DIAGNOSIS — R42 Dizziness and giddiness: Secondary | ICD-10-CM | POA: Diagnosis not present

## 2017-01-09 DIAGNOSIS — R262 Difficulty in walking, not elsewhere classified: Secondary | ICD-10-CM | POA: Diagnosis not present

## 2017-01-09 DIAGNOSIS — R293 Abnormal posture: Secondary | ICD-10-CM | POA: Diagnosis not present

## 2017-01-09 DIAGNOSIS — R296 Repeated falls: Secondary | ICD-10-CM | POA: Diagnosis not present

## 2017-01-11 ENCOUNTER — Ambulatory Visit
Admission: RE | Admit: 2017-01-11 | Discharge: 2017-01-11 | Disposition: A | Discharge status: Home / Self Care | Payer: PPO | Source: Ambulatory Visit | Attending: Physician Assistant | Admitting: Physician Assistant

## 2017-01-11 DIAGNOSIS — G20 Parkinson's disease: Secondary | ICD-10-CM | POA: Diagnosis not present

## 2017-01-11 DIAGNOSIS — R1313 Dysphagia, pharyngeal phase: Secondary | ICD-10-CM

## 2017-01-11 DIAGNOSIS — R1319 Other dysphagia: Secondary | ICD-10-CM | POA: Insufficient documentation

## 2017-01-11 NOTE — Therapy (Signed)
[facility] [place], [place], [address] Phone: [phone]   Fax:     Modified Barium Swallow  Patient Details  Name: Christian Anderson. MRN: [id number] Date of Birth: 01/20/1951 No Data Recorded  Encounter Date: 01/11/2017      End of Session - 01/11/17 1324    Visit Number 1   Number of Visits 1   Date for SLP Re-Evaluation 01/11/17   SLP Start Time 64   SLP Stop Time  1324   SLP Time Calculation (min) 44 min   Activity Tolerance Patient tolerated treatment well      Past Medical History:  Diagnosis Date  . Anxiety   . Depression   . Hyperlipemia   . Hypertension   . Tremor     Past Surgical History:  Procedure Laterality Date  . SQUAMOUS CELL CARCINOMA EXCISION    . TOTAL HIP ARTHROPLASTY     bilateral  . veins stripping      There were no vitals filed for this visit.   Subjective: Patient behavior: (alertness, ability to follow instructions, etc.): the patient is able to express his swallowing history and follow directions.  He is hypophonic.  Chief complaint: some choking on foods ("only at dinner" "sporatic")   Objective:  Radiological Procedure: A videoflouroscopic evaluation of oral-preparatory, reflex initiation, and pharyngeal phases of the swallow was performed; as well as a screening of the upper esophageal phase.  I. POSTURE: Upright in MBS chair  II. VIEW: Lateral  III. COMPENSATORY STRATEGIES: N/A, although thin liquid encouraged increased effort with improved pharyngeal clearance  IV. BOLUSES ADMINISTERED:   Thin Liquid: 2 small sips, 3 rapid consecutive sips   Nectar-thick Liquid: 3 rapid, consecutive sips   Honey-thick Liquid: DNT   Puree: 2 teaspoon presentations   Mechanical Soft: 1/4 graham cracker in applesauce  V. RESULTS OF EVALUATION: A. ORAL PREPARATORY PHASE: (The lips, tongue, and velum are observed for strength and coordination)  **Overall Severity Rating: Within normal limits  B. SWALLOW INITIATION/REFLEX: (The reflex is normal if "triggered" by the time the bolus reached the base of the tongue)  **Overall Severity Rating: Within normal limits  C. PHARYNGEAL PHASE: (Pharyngeal function is normal if the bolus shows rapid, smooth, and continuous transit through the pharynx and there is no pharyngeal residue after the swallow)  **Overall Severity Rating: Mild; reduced hyolaryngeal excursion, reduced tongue base retraction, incomplete epiglottic inversion, and mild pharyngeal residue post swallow (less with thin liquid).  Thin liquid encouraged increased effort with improved pharyngeal clearance.   D. LARYNGEAL PENETRATION: (Material entering into the laryngeal inlet/vestibule but not aspirated) None  E. ASPIRATION: None  F. ESOPHAGEAL PHASE: (Screening of the upper esophagus): Large cervical vertebrae that appear to narrow the esophagus but do not restrict bolus flow   ASSESSMENT: This 66 year old man; with Parkinson's and Meniere's diseases and complaints including "some coughing and sneezing when eating"; is presenting with mild pharyngeal dysphagia characterized by reduced hyolaryngeal excursion, reduced tongue base retraction, incomplete epiglottic inversion, and mild pharyngeal residue post swallow (less with thin liquid).  Thin liquid encouraged increased effort with improved pharyngeal clearance.  There was no observed laryngeal penetration or aspiration during this study.  The patient does not appear to be at significant risk for prandial aspiration and his current complaints are more likely due to esophageal dysphagia.  In view of the Parkinson's disease, the patient is at risk for deteriorating oropharyngeal dysphagia/airway protection.  The  patient was counseled to monitor for signs of aspiration, such as coughing / choking during the swallow, difficulty with liquids, sensation of 'it going the wrong way", and  increased pulmonary compromise.  The patient is soft spoken and may benefit from LSVT-LOUD.   Additionally, LSVT seemingly improved neuromuscular control of the entire upper aerodigestive tract, improving oral tongue and tongue base function during the oral and pharyngeal phases of swallowing as well as improving vocal intensity.    PLAN/RECOMMENDATIONS:   A. Diet: regular, monitor for specific problematic foods and avoid   B. Swallowing Precautions: Monitor for signs/symptoms of aspiration   C. Recommended consultation to: GI   D. Therapy recommendations: LSVT-LOUD   E. Results and recommendations were discussed with the patient immediately following the study and the final report routed to the referring practitioner.    Patient will benefit from skilled therapeutic intervention in order to improve the following deficits and impairments:   Dysphagia, pharyngeal phase - Plan: DG OP Swallowing Func-Medicare/Speech Path, DG OP Swallowing Func-Medicare/Speech Path  Parkinsons disease (HCC) - Plan: DG OP Swallowing Func-Medicare/Speech Path, DG OP Swallowing Func-Medicare/Speech Path      G-Codes - January 15, 2017 1325    Functional Assessment Tool Used MBS, clinical judgment   Functional Limitations Swallowing   Swallow Current Status (O6712) At least 20 percent but less than 40 percent impaired, limited or restricted   Swallow Goal Status (W5809) At least 20 percent but less than 40 percent impaired, limited or restricted   Swallow Discharge Status [phone]) At least 20 percent but less than 40 percent impaired, limited or restricted          Problem List Patient Active Problem List   Diagnosis Date Noted  . Encounter for examination for normal comparison or control in clinical research program 10/21/2015  . Amnesia 08/26/2015  . Concussion injury of body structure 12/23/2014  . Acute confusion due to medical condition 12/23/2014  . Depression, major, single episode, moderate ([place])  12/23/2014  . H/O gastrointestinal disease 12/23/2014  . Anxiety, generalized 12/23/2014  . Hallucination, visual 12/23/2014  . H/O elevated lipids 12/23/2014  . H/O: HTN (hypertension) 12/23/2014  . Abnormal mental state 12/23/2014  . Insomnia, persistent 12/23/2014  . H/O: osteoarthritis 12/23/2014  . Restless leg syndrome 12/23/2014  . Parkinson disease, symptomatic ([place]) 12/23/2014  . General psychoses 12/23/2014  . Apnea, sleep 12/23/2014  . H/O disease 12/23/2014  . Restless leg 12/23/2014  . Concussion w/o coma 12/23/2014  . Major depressive disorder, single episode, moderate ([place]) 12/23/2014  . Cannot sleep 12/23/2014  . H/O cardiovascular disorder 12/23/2014  . Personal history of other specified conditions 12/23/2014  . Personal history of other diseases of the musculoskeletal system and connective tissue 12/23/2014  . 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 Hypercholesterolemia without hypertriglyceridemia 06/11/2014  . Pure hypercholesterolemia 06/11/2014  . Arthralgia of hip 02/06/2014  . H/O total hip arthroplasty 02/06/2014  . History of artificial joint 02/06/2014  . Adiposity 01/21/2014  . Idiopathic Parkinson's disease ([place]) 01/21/2014  . Parkinson's disease ([place]) 01/21/2014  . BP (high blood pressure) 12/23/2013  . Edema, peripheral 12/23/2013  . Accumulation of fluid in tissues 12/23/2013  . Essential (primary) hypertension 12/23/2013  . Edema 12/23/2013  . Confusion state 10/17/2013  . Disassociation disorder 10/17/2013  . Anxiety 09/13/2013  . Clinical depression 09/13/2013  . Disordered sleep 09/13/2013  . Major depressive disorder with single episode 09/13/2013  . Tremor 01/28/2013     Leroy Sea, MS/CCC- SLP  Lou Miner  01/11/2017, 1:25 PM  [facility] [place], [place], [address] Phone: [phone]   Fax:     Name: Christian Anderson. MRN: [id number] Date  of Birth: 1951-02-13

## 2017-01-13 ENCOUNTER — Encounter: Payer: Self-pay | Admitting: Urology

## 2017-01-13 ENCOUNTER — Ambulatory Visit (INDEPENDENT_AMBULATORY_CARE_PROVIDER_SITE_OTHER): Payer: PPO | Admitting: Urology

## 2017-01-13 VITALS — BP 145/85 | HR 64 | Ht 69.0 in | Wt 230.7 lb

## 2017-01-13 DIAGNOSIS — R296 Repeated falls: Secondary | ICD-10-CM | POA: Diagnosis not present

## 2017-01-13 DIAGNOSIS — R972 Elevated prostate specific antigen [PSA]: Secondary | ICD-10-CM | POA: Diagnosis not present

## 2017-01-13 DIAGNOSIS — R262 Difficulty in walking, not elsewhere classified: Secondary | ICD-10-CM | POA: Diagnosis not present

## 2017-01-13 DIAGNOSIS — R350 Frequency of micturition: Secondary | ICD-10-CM | POA: Diagnosis not present

## 2017-01-13 DIAGNOSIS — R2681 Unsteadiness on feet: Secondary | ICD-10-CM | POA: Diagnosis not present

## 2017-01-13 DIAGNOSIS — G20 Parkinson's disease: Secondary | ICD-10-CM | POA: Diagnosis not present

## 2017-01-13 DIAGNOSIS — R293 Abnormal posture: Secondary | ICD-10-CM | POA: Diagnosis not present

## 2017-01-13 LAB — URINALYSIS, COMPLETE
BILIRUBIN UA: NEGATIVE
Glucose, UA: NEGATIVE
Ketones, UA: NEGATIVE
LEUKOCYTES UA: NEGATIVE
Nitrite, UA: NEGATIVE
PH UA: 7 (ref 5.0–7.5)
PROTEIN UA: NEGATIVE
RBC, UA: NEGATIVE
Specific Gravity, UA: 1.015 (ref 1.005–1.030)
Urobilinogen, Ur: 0.2 mg/dL (ref 0.2–1.0)

## 2017-01-13 NOTE — Progress Notes (Signed)
01/13/2017 10:27 AM   Christian Anderson. 04/01/1951 [id number]  Referring provider: Idelle Crouch, MD [facility] [place], [place] [address]  Chief Complaint  Patient presents with  . Elevated PSA    1 year follow up    HPI:   1. Elevated PSA - PSA was 6.07 in April 2017. It was 3.86 two years prior. He does have Parkinson's disease and a fair amount of other medical problems. Pt chose surveillance. His PSA declined August 2017 to 3.2.  2. Urinary frequency/urgency Currenlty on Myrbetriq  50 mg daily.His urgency and urinary frequency is completely resolved on this medication. He is very happy with this medication, but insurance didn't cover so he switched to [employer].   Patient returns in continued management of the above. His PSA 01/06/2017 was 3. He continues Retail buyer. He voids with a good flow. He was dizzy and his psychiatrist stopped vesicare for a week, but symptoms didn't change. He's back on vesicare. He noticed he has trouble getting clean after a BM. He's had a c-scope.   PMH: Past Medical History:  Diagnosis Date  . Anxiety   . Depression   . Hyperlipemia   . Hypertension   . Tremor     Surgical History: Past Surgical History:  Procedure Laterality Date  . SQUAMOUS CELL CARCINOMA EXCISION    . TOTAL HIP ARTHROPLASTY     bilateral  . veins stripping      Home Medications:  Allergies as of 01/13/2017   No Known Allergies     Medication List       Accurate as of 01/13/17 10:27 AM. Always use your most recent med list.          allopurinol 100 MG tablet Commonly known as:  ZYLOPRIM TK 1 T PO QD   aspirin 325 MG tablet Take 325 mg by mouth daily.   benztropine 0.5 MG tablet Commonly known as:  COGENTIN TK 1 T PO Q NIGHT   bumetanide 1 MG tablet Commonly known as:  BUMEX TK 1 T PO QD   carbidopa-levodopa 25-250 MG tablet Commonly known as:  SINEMET IR TK 1 T PO QID   CIALIS 20 MG tablet Generic drug:   tadalafil Reported on 10/22/2015   donepezil 5 MG tablet Commonly known as:  ARICEPT Take by mouth.   donepezil 5 MG tablet Commonly known as:  ARICEPT Take by mouth.   FISH OIL PO Take by mouth.   hydrochlorothiazide 25 MG tablet Commonly known as:  HYDRODIURIL 25 mg once daily. Taking 1/2 tablet   lisinopril 20 MG tablet Commonly known as:  PRINIVIL,ZESTRIL Take 20 mg by mouth daily.   Melatonin 10 MG Tabs Take by mouth.   meloxicam 7.5 MG tablet Commonly known as:  MOBIC Reported on 10/22/2015   memantine 5 MG tablet Commonly known as:  NAMENDA Reported on 10/22/2015   MULTI-VITAMINS Tabs Take by mouth.   MYRBETRIQ 50 MG Tb24 tablet Generic drug:  mirabegron ER Reported on 10/22/2015   mirabegron ER 50 MG Tb24 tablet Commonly known as:  MYRBETRIQ Take 1 tablet (50 mg total) by mouth daily.   mirabegron ER 50 MG Tb24 tablet Commonly known as:  MYRBETRIQ Take 1 tablet (50 mg total) by mouth daily.   nadolol 80 MG tablet Commonly known as:  CORGARD 1 1/2 bid   pantoprazole 40 MG tablet Commonly known as:  PROTONIX TK 1 T PO QD   RA VITAMIN B-12 TR  1000 MCG Tbcr Generic drug:  Cyanocobalamin Take by mouth.   senna 8.6 MG Tabs tablet Commonly known as:  SENOKOT Take by mouth.   sertraline 100 MG tablet Commonly known as:  ZOLOFT Take 1 tablet (100 mg total) by mouth daily.   solifenacin 5 MG tablet Commonly known as:  VESICARE Take 1 tablet (5 mg total) by mouth daily.   traZODone 100 MG tablet Commonly known as:  DESYREL TK 1 TO 2 TS PO HS       Allergies: No Known Allergies  Family History: Family History  Problem Relation Age of Onset  . Diabetes Father   . Heart attack Father   . Alzheimer's disease Mother   . Cancer - Ovarian Mother   . Leukemia Mother   . Breast cancer Sister   . Anxiety disorder Sister   . Depression Sister   . Headache Sister   . Prostate cancer Neg Hx   . Kidney cancer Neg Hx   . Bladder Cancer Neg Hx       Social History:  reports that he quit smoking about 38 years ago. His smoking use included Cigarettes and Cigars. He smoked 0.50 packs per day. He has never used smokeless tobacco. He reports that he drinks about 9.6 oz of alcohol per week . He reports that he does not use drugs.  ROS: UROLOGY Frequent Urination?: No Hard to postpone urination?: Yes Burning/pain with urination?: No Get up at night to urinate?: Yes Leakage of urine?: Yes Urine stream starts and stops?: Yes Trouble starting stream?: No Do you have to strain to urinate?: Yes Blood in urine?: No Urinary tract infection?: No Sexually transmitted disease?: No Injury to kidneys or bladder?: No Painful intercourse?: No Weak stream?: No Erection problems?: Yes Penile pain?: No  Gastrointestinal Nausea?: No Vomiting?: No Indigestion/heartburn?: No Diarrhea?: No Constipation?: No  Constitutional Fever: No Night sweats?: No Weight loss?: No Fatigue?: Yes  Skin Skin rash/lesions?: No Itching?: No  Eyes Blurred vision?: Yes Double vision?: No  Ears/Nose/Throat Sore throat?: No Sinus problems?: No  Hematologic/Lymphatic Swollen glands?: No Easy bruising?: No  Cardiovascular Leg swelling?: No Chest pain?: No  Respiratory Cough?: No Shortness of breath?: No  Endocrine Excessive thirst?: No  Musculoskeletal Back pain?: Yes Joint pain?: Yes  Neurological Headaches?: No Dizziness?: Yes  Psychologic Depression?: Yes Anxiety?: Yes  Physical Exam: BP (!) 145/85   Pulse 64   Ht 5\' 9"  (1.753 m)   Wt 104.6 kg (230 lb 11.2 oz)   BMI 34.07 kg/m   Constitutional:  Alert and oriented, No acute distress. HEENT: [place] AT, moist mucus membranes.  Trachea midline, no masses. Cardiovascular: No clubbing, cyanosis, or edema. Respiratory: Normal respiratory effort, no increased work of breathing. GI: Abdomen is soft, nontender, nondistended, no abdominal masses GU: No CVA tenderness.  DRE: prostate  30 g, smooth, no hard area or nodule   Skin: No rashes, bruises or suspicious lesions. Lymph: No cervical or inguinal adenopathy. Neurologic: Grossly intact, no focal deficits, moving all 4 extremities. Psychiatric: Normal mood and affect.  Laboratory Data: Lab Results  Component Value Date   WBC 7.8 10/12/2013   HGB 15.4 10/12/2013   HCT 48.3 10/12/2013   MCV 93 10/12/2013   PLT 243 10/12/2013    Lab Results  Component Value Date   CREATININE 1.03 10/12/2013    No results found for: PSA  No results found for: TESTOSTERONE  No results found for: HGBA1C  Urinalysis    Component Value  Date/Time   COLORURINE Yellow 10/07/2013 1333   APPEARANCEUR Clear 10/22/2015 0957   LABSPEC 1.012 10/07/2013 1333   PHURINE 7.0 10/07/2013 1333   GLUCOSEU Negative 10/22/2015 0957   GLUCOSEU Negative 10/07/2013 1333   HGBUR Negative 10/07/2013 1333   BILIRUBINUR Negative 10/22/2015 0957   BILIRUBINUR Negative 10/07/2013 1333   KETONESUR Negative 10/07/2013 1333   PROTEINUR Negative 10/22/2015 0957   PROTEINUR Negative 10/07/2013 1333   NITRITE Negative 10/22/2015 0957   NITRITE Negative 10/07/2013 1333   LEUKOCYTESUR Negative 10/22/2015 0957   LEUKOCYTESUR Negative 10/07/2013 1333     Assessment & Plan:    1. Elevated PSA Improved - normal DRE> Check PSA and DRE in 1 year.   2. Frequency - stable on Vesicare.  - Urinalysis, Complete   No Follow-up on file.  Festus Aloe, [facility] [address] [place], [place] [address] [phone]

## 2017-01-16 DIAGNOSIS — R293 Abnormal posture: Secondary | ICD-10-CM | POA: Diagnosis not present

## 2017-01-16 DIAGNOSIS — G20 Parkinson's disease: Secondary | ICD-10-CM | POA: Diagnosis not present

## 2017-01-16 DIAGNOSIS — R296 Repeated falls: Secondary | ICD-10-CM | POA: Diagnosis not present

## 2017-01-16 DIAGNOSIS — R262 Difficulty in walking, not elsewhere classified: Secondary | ICD-10-CM | POA: Diagnosis not present

## 2017-01-16 DIAGNOSIS — R2681 Unsteadiness on feet: Secondary | ICD-10-CM | POA: Diagnosis not present

## 2017-01-18 DIAGNOSIS — R262 Difficulty in walking, not elsewhere classified: Secondary | ICD-10-CM | POA: Diagnosis not present

## 2017-01-18 DIAGNOSIS — R293 Abnormal posture: Secondary | ICD-10-CM | POA: Diagnosis not present

## 2017-01-18 DIAGNOSIS — G20 Parkinson's disease: Secondary | ICD-10-CM | POA: Diagnosis not present

## 2017-01-18 DIAGNOSIS — R2681 Unsteadiness on feet: Secondary | ICD-10-CM | POA: Diagnosis not present

## 2017-01-18 DIAGNOSIS — R296 Repeated falls: Secondary | ICD-10-CM | POA: Diagnosis not present

## 2017-01-19 DIAGNOSIS — Z96643 Presence of artificial hip joint, bilateral: Secondary | ICD-10-CM | POA: Diagnosis not present

## 2017-01-19 DIAGNOSIS — M898X9 Other specified disorders of bone, unspecified site: Secondary | ICD-10-CM | POA: Diagnosis not present

## 2017-01-20 DIAGNOSIS — G20 Parkinson's disease: Secondary | ICD-10-CM | POA: Diagnosis not present

## 2017-01-20 DIAGNOSIS — R2681 Unsteadiness on feet: Secondary | ICD-10-CM | POA: Diagnosis not present

## 2017-01-20 DIAGNOSIS — R293 Abnormal posture: Secondary | ICD-10-CM | POA: Diagnosis not present

## 2017-01-20 DIAGNOSIS — R296 Repeated falls: Secondary | ICD-10-CM | POA: Diagnosis not present

## 2017-01-20 DIAGNOSIS — R262 Difficulty in walking, not elsewhere classified: Secondary | ICD-10-CM | POA: Diagnosis not present

## 2017-01-23 DIAGNOSIS — B351 Tinea unguium: Secondary | ICD-10-CM | POA: Diagnosis not present

## 2017-01-23 DIAGNOSIS — R293 Abnormal posture: Secondary | ICD-10-CM | POA: Diagnosis not present

## 2017-01-23 DIAGNOSIS — R296 Repeated falls: Secondary | ICD-10-CM | POA: Diagnosis not present

## 2017-01-23 DIAGNOSIS — R262 Difficulty in walking, not elsewhere classified: Secondary | ICD-10-CM | POA: Diagnosis not present

## 2017-01-23 DIAGNOSIS — G20 Parkinson's disease: Secondary | ICD-10-CM | POA: Diagnosis not present

## 2017-01-23 DIAGNOSIS — L600 Ingrowing nail: Secondary | ICD-10-CM | POA: Diagnosis not present

## 2017-01-23 DIAGNOSIS — M216X9 Other acquired deformities of unspecified foot: Secondary | ICD-10-CM | POA: Diagnosis not present

## 2017-01-23 DIAGNOSIS — Q6621 Congenital metatarsus primus varus: Secondary | ICD-10-CM | POA: Diagnosis not present

## 2017-01-23 DIAGNOSIS — R2681 Unsteadiness on feet: Secondary | ICD-10-CM | POA: Diagnosis not present

## 2017-01-25 DIAGNOSIS — G20 Parkinson's disease: Secondary | ICD-10-CM | POA: Diagnosis not present

## 2017-01-25 DIAGNOSIS — R293 Abnormal posture: Secondary | ICD-10-CM | POA: Diagnosis not present

## 2017-01-25 DIAGNOSIS — R262 Difficulty in walking, not elsewhere classified: Secondary | ICD-10-CM | POA: Diagnosis not present

## 2017-01-25 DIAGNOSIS — R296 Repeated falls: Secondary | ICD-10-CM | POA: Diagnosis not present

## 2017-01-25 DIAGNOSIS — R2681 Unsteadiness on feet: Secondary | ICD-10-CM | POA: Diagnosis not present

## 2017-01-27 DIAGNOSIS — G20 Parkinson's disease: Secondary | ICD-10-CM | POA: Diagnosis not present

## 2017-01-27 DIAGNOSIS — R2681 Unsteadiness on feet: Secondary | ICD-10-CM | POA: Diagnosis not present

## 2017-01-27 DIAGNOSIS — R296 Repeated falls: Secondary | ICD-10-CM | POA: Diagnosis not present

## 2017-01-27 DIAGNOSIS — R262 Difficulty in walking, not elsewhere classified: Secondary | ICD-10-CM | POA: Diagnosis not present

## 2017-01-27 DIAGNOSIS — R293 Abnormal posture: Secondary | ICD-10-CM | POA: Diagnosis not present

## 2017-01-30 DIAGNOSIS — R262 Difficulty in walking, not elsewhere classified: Secondary | ICD-10-CM | POA: Diagnosis not present

## 2017-01-30 DIAGNOSIS — R296 Repeated falls: Secondary | ICD-10-CM | POA: Diagnosis not present

## 2017-01-30 DIAGNOSIS — R293 Abnormal posture: Secondary | ICD-10-CM | POA: Diagnosis not present

## 2017-01-30 DIAGNOSIS — R2681 Unsteadiness on feet: Secondary | ICD-10-CM | POA: Diagnosis not present

## 2017-01-30 DIAGNOSIS — G20 Parkinson's disease: Secondary | ICD-10-CM | POA: Diagnosis not present

## 2017-02-01 DIAGNOSIS — R262 Difficulty in walking, not elsewhere classified: Secondary | ICD-10-CM | POA: Diagnosis not present

## 2017-02-01 DIAGNOSIS — G20 Parkinson's disease: Secondary | ICD-10-CM | POA: Diagnosis not present

## 2017-02-01 DIAGNOSIS — R296 Repeated falls: Secondary | ICD-10-CM | POA: Diagnosis not present

## 2017-02-01 DIAGNOSIS — R293 Abnormal posture: Secondary | ICD-10-CM | POA: Diagnosis not present

## 2017-02-01 DIAGNOSIS — R2681 Unsteadiness on feet: Secondary | ICD-10-CM | POA: Diagnosis not present

## 2017-02-02 DIAGNOSIS — M47816 Spondylosis without myelopathy or radiculopathy, lumbar region: Secondary | ICD-10-CM | POA: Diagnosis not present

## 2017-02-03 DIAGNOSIS — G20 Parkinson's disease: Secondary | ICD-10-CM | POA: Diagnosis not present

## 2017-02-03 DIAGNOSIS — R293 Abnormal posture: Secondary | ICD-10-CM | POA: Diagnosis not present

## 2017-02-03 DIAGNOSIS — R262 Difficulty in walking, not elsewhere classified: Secondary | ICD-10-CM | POA: Diagnosis not present

## 2017-02-03 DIAGNOSIS — R296 Repeated falls: Secondary | ICD-10-CM | POA: Diagnosis not present

## 2017-02-03 DIAGNOSIS — R2681 Unsteadiness on feet: Secondary | ICD-10-CM | POA: Diagnosis not present

## 2017-02-07 DIAGNOSIS — R293 Abnormal posture: Secondary | ICD-10-CM | POA: Diagnosis not present

## 2017-02-07 DIAGNOSIS — G20 Parkinson's disease: Secondary | ICD-10-CM | POA: Diagnosis not present

## 2017-02-07 DIAGNOSIS — R2681 Unsteadiness on feet: Secondary | ICD-10-CM | POA: Diagnosis not present

## 2017-02-07 DIAGNOSIS — R262 Difficulty in walking, not elsewhere classified: Secondary | ICD-10-CM | POA: Diagnosis not present

## 2017-02-07 DIAGNOSIS — R296 Repeated falls: Secondary | ICD-10-CM | POA: Diagnosis not present

## 2017-02-08 DIAGNOSIS — G20 Parkinson's disease: Secondary | ICD-10-CM | POA: Diagnosis not present

## 2017-02-08 DIAGNOSIS — R293 Abnormal posture: Secondary | ICD-10-CM | POA: Diagnosis not present

## 2017-02-08 DIAGNOSIS — R2681 Unsteadiness on feet: Secondary | ICD-10-CM | POA: Diagnosis not present

## 2017-02-08 DIAGNOSIS — R262 Difficulty in walking, not elsewhere classified: Secondary | ICD-10-CM | POA: Diagnosis not present

## 2017-02-08 DIAGNOSIS — R296 Repeated falls: Secondary | ICD-10-CM | POA: Diagnosis not present

## 2017-02-10 DIAGNOSIS — G20 Parkinson's disease: Secondary | ICD-10-CM | POA: Diagnosis not present

## 2017-02-10 DIAGNOSIS — R296 Repeated falls: Secondary | ICD-10-CM | POA: Diagnosis not present

## 2017-02-10 DIAGNOSIS — R262 Difficulty in walking, not elsewhere classified: Secondary | ICD-10-CM | POA: Diagnosis not present

## 2017-02-10 DIAGNOSIS — R2681 Unsteadiness on feet: Secondary | ICD-10-CM | POA: Diagnosis not present

## 2017-02-10 DIAGNOSIS — R293 Abnormal posture: Secondary | ICD-10-CM | POA: Diagnosis not present

## 2017-02-13 DIAGNOSIS — R262 Difficulty in walking, not elsewhere classified: Secondary | ICD-10-CM | POA: Diagnosis not present

## 2017-02-13 DIAGNOSIS — R296 Repeated falls: Secondary | ICD-10-CM | POA: Diagnosis not present

## 2017-02-13 DIAGNOSIS — R293 Abnormal posture: Secondary | ICD-10-CM | POA: Diagnosis not present

## 2017-02-13 DIAGNOSIS — R2681 Unsteadiness on feet: Secondary | ICD-10-CM | POA: Diagnosis not present

## 2017-02-13 DIAGNOSIS — G20 Parkinson's disease: Secondary | ICD-10-CM | POA: Diagnosis not present

## 2017-02-15 DIAGNOSIS — R296 Repeated falls: Secondary | ICD-10-CM | POA: Diagnosis not present

## 2017-02-15 DIAGNOSIS — R293 Abnormal posture: Secondary | ICD-10-CM | POA: Diagnosis not present

## 2017-02-15 DIAGNOSIS — Z79899 Other long term (current) drug therapy: Secondary | ICD-10-CM | POA: Diagnosis not present

## 2017-02-15 DIAGNOSIS — R2681 Unsteadiness on feet: Secondary | ICD-10-CM | POA: Diagnosis not present

## 2017-02-15 DIAGNOSIS — F0280 Dementia in other diseases classified elsewhere without behavioral disturbance: Secondary | ICD-10-CM | POA: Diagnosis not present

## 2017-02-15 DIAGNOSIS — F411 Generalized anxiety disorder: Secondary | ICD-10-CM | POA: Diagnosis not present

## 2017-02-15 DIAGNOSIS — R262 Difficulty in walking, not elsewhere classified: Secondary | ICD-10-CM | POA: Diagnosis not present

## 2017-02-15 DIAGNOSIS — G20 Parkinson's disease: Secondary | ICD-10-CM | POA: Diagnosis not present

## 2017-02-15 DIAGNOSIS — F331 Major depressive disorder, recurrent, moderate: Secondary | ICD-10-CM | POA: Diagnosis not present

## 2017-02-16 DIAGNOSIS — F411 Generalized anxiety disorder: Secondary | ICD-10-CM | POA: Diagnosis not present

## 2017-02-16 DIAGNOSIS — Z1159 Encounter for screening for other viral diseases: Secondary | ICD-10-CM | POA: Diagnosis not present

## 2017-02-16 DIAGNOSIS — G20 Parkinson's disease: Secondary | ICD-10-CM | POA: Diagnosis not present

## 2017-02-16 DIAGNOSIS — E7800 Pure hypercholesterolemia, unspecified: Secondary | ICD-10-CM | POA: Diagnosis not present

## 2017-02-16 DIAGNOSIS — R42 Dizziness and giddiness: Secondary | ICD-10-CM | POA: Diagnosis not present

## 2017-02-16 DIAGNOSIS — R251 Tremor, unspecified: Secondary | ICD-10-CM | POA: Diagnosis not present

## 2017-02-16 DIAGNOSIS — I10 Essential (primary) hypertension: Secondary | ICD-10-CM | POA: Diagnosis not present

## 2017-02-16 DIAGNOSIS — R7309 Other abnormal glucose: Secondary | ICD-10-CM | POA: Diagnosis not present

## 2017-02-16 DIAGNOSIS — Z79899 Other long term (current) drug therapy: Secondary | ICD-10-CM | POA: Diagnosis not present

## 2017-02-20 DIAGNOSIS — R296 Repeated falls: Secondary | ICD-10-CM | POA: Diagnosis not present

## 2017-02-20 DIAGNOSIS — R293 Abnormal posture: Secondary | ICD-10-CM | POA: Diagnosis not present

## 2017-02-20 DIAGNOSIS — G20 Parkinson's disease: Secondary | ICD-10-CM | POA: Diagnosis not present

## 2017-02-20 DIAGNOSIS — R262 Difficulty in walking, not elsewhere classified: Secondary | ICD-10-CM | POA: Diagnosis not present

## 2017-02-20 DIAGNOSIS — R2681 Unsteadiness on feet: Secondary | ICD-10-CM | POA: Diagnosis not present

## 2017-02-22 DIAGNOSIS — G20 Parkinson's disease: Secondary | ICD-10-CM | POA: Diagnosis not present

## 2017-02-22 DIAGNOSIS — R262 Difficulty in walking, not elsewhere classified: Secondary | ICD-10-CM | POA: Diagnosis not present

## 2017-02-22 DIAGNOSIS — R296 Repeated falls: Secondary | ICD-10-CM | POA: Diagnosis not present

## 2017-02-22 DIAGNOSIS — R293 Abnormal posture: Secondary | ICD-10-CM | POA: Diagnosis not present

## 2017-02-22 DIAGNOSIS — R2681 Unsteadiness on feet: Secondary | ICD-10-CM | POA: Diagnosis not present

## 2017-02-24 ENCOUNTER — Emergency Department
Admission: EM | Admit: 2017-02-24 | Discharge: 2017-02-24 | Disposition: A | Discharge status: Home / Self Care | Payer: PPO | Attending: Emergency Medicine | Admitting: Emergency Medicine

## 2017-02-24 ENCOUNTER — Encounter: Payer: Self-pay | Admitting: Emergency Medicine

## 2017-02-24 DIAGNOSIS — G20 Parkinson's disease: Secondary | ICD-10-CM | POA: Insufficient documentation

## 2017-02-24 DIAGNOSIS — I10 Essential (primary) hypertension: Secondary | ICD-10-CM | POA: Insufficient documentation

## 2017-02-24 DIAGNOSIS — Z79899 Other long term (current) drug therapy: Secondary | ICD-10-CM | POA: Insufficient documentation

## 2017-02-24 DIAGNOSIS — Z87891 Personal history of nicotine dependence: Secondary | ICD-10-CM | POA: Diagnosis not present

## 2017-02-24 DIAGNOSIS — Z96643 Presence of artificial hip joint, bilateral: Secondary | ICD-10-CM | POA: Diagnosis not present

## 2017-02-24 DIAGNOSIS — Z85828 Personal history of other malignant neoplasm of skin: Secondary | ICD-10-CM | POA: Insufficient documentation

## 2017-02-24 DIAGNOSIS — R42 Dizziness and giddiness: Secondary | ICD-10-CM | POA: Diagnosis not present

## 2017-02-24 DIAGNOSIS — R9431 Abnormal electrocardiogram [ECG] [EKG]: Secondary | ICD-10-CM | POA: Diagnosis not present

## 2017-02-24 LAB — COMPREHENSIVE METABOLIC PANEL
ALBUMIN: 4.2 g/dL (ref 3.5–5.0)
ALT: 21 U/L (ref 17–63)
ANION GAP: 7 (ref 5–15)
AST: 24 U/L (ref 15–41)
Alkaline Phosphatase: 69 U/L (ref 38–126)
BUN: 17 mg/dL (ref 6–20)
CHLORIDE: 100 mmol/L — AB (ref 101–111)
CO2: 30 mmol/L (ref 22–32)
Calcium: 9.4 mg/dL (ref 8.9–10.3)
Creatinine, Ser: 0.73 mg/dL (ref 0.61–1.24)
GFR calc Af Amer: >60 mL/min (ref >60)
GFR calc non Af Amer: >60 mL/min (ref >60)
Glucose, Bld: 111 mg/dL — ABNORMAL HIGH (ref 65–99)
Potassium: 4.9 mmol/L (ref 3.5–5.1)
SODIUM: 137 mmol/L (ref 135–145)
Total Bilirubin: 0.7 mg/dL (ref 0.3–1.2)
Total Protein: 7.4 g/dL (ref 6.5–8.1)

## 2017-02-24 LAB — CBC
HCT: 42.3 % (ref 40.0–52.0)
Hemoglobin: 14.5 g/dL (ref 13.0–18.0)
MCH: 30.8 pg (ref 26.0–34.0)
MCHC: 34.2 g/dL (ref 32.0–36.0)
MCV: 90.1 fL (ref 80.0–100.0)
PLATELETS: 191 10*3/uL (ref 150–440)
RBC: 4.7 MIL/uL (ref 4.40–5.90)
RDW: 13.3 % (ref 11.5–14.5)
WBC: 9.8 10*3/uL (ref 3.8–10.6)

## 2017-02-24 LAB — TROPONIN I

## 2017-02-24 LAB — LIPASE, BLOOD: LIPASE: 26 U/L (ref 11–51)

## 2017-02-24 NOTE — ED Provider Notes (Signed)
[facility] Emergency Department Provider Note  ____________________________________________  Time seen: Approximately 2:55 PM  I have reviewed the triage vital signs and the nursing notes.   HISTORY  Chief Complaint Dizziness    HPI Christian Anderson. is a 66 y.o. male who complains of an episode of dizziness that started at 9:10 AM today and resolved at 9:30 AM. He has a history of vertigo, is doing vestibular rehabilitation which has been improving his symptoms. 2 days ago he switched from trazodone to Seroquel for his nighttime sleep medicine, and then starting one day ago he started having some episodes of vomiting. They called his psychiatrist who said to stop the Seroquel and: Switch back to trazodone. They called his primary care doctor who sent to be evaluated either in the clinic or in the ER. Because the patient was feeling very weak at the time when he was symptomatic, they called EMS and brought him to the ER. Patient reports that he's been asymptomatic since 9:30. No vomiting since then.  Denies vision changes headache numbness tingling weakness or neck pain. No chest pain or shortness of breath.   Past Medical History:  Diagnosis Date  . Anxiety   . Depression   . Hyperlipemia   . Hypertension   . Tremor      Patient Active Problem List   Diagnosis Date Noted  . Encounter for examination for normal comparison or control in clinical research program 10/21/2015  . Amnesia 08/26/2015  . Concussion injury of body structure 12/23/2014  . Acute confusion due to medical condition 12/23/2014  . Depression, major, single episode, moderate ([place]) 12/23/2014  . H/O gastrointestinal disease 12/23/2014  . Anxiety, generalized 12/23/2014  . Hallucination, visual 12/23/2014  . H/O elevated lipids 12/23/2014  . H/O: HTN (hypertension) 12/23/2014  . Abnormal mental state 12/23/2014  . Insomnia, persistent 12/23/2014  . H/O: osteoarthritis 12/23/2014   . Restless leg syndrome 12/23/2014  . Parkinson disease, symptomatic ([place]) 12/23/2014  . General psychoses 12/23/2014  . Apnea, sleep 12/23/2014  . H/O disease 12/23/2014  . Restless leg 12/23/2014  . Concussion w/o coma 12/23/2014  . Major depressive disorder, single episode, moderate ([place]) 12/23/2014  . Cannot sleep 12/23/2014  . H/O cardiovascular disorder 12/23/2014  . Personal history of other specified conditions 12/23/2014  . Personal history of other diseases of the musculoskeletal system and connective tissue 12/23/2014  . 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 Hypercholesterolemia without hypertriglyceridemia 06/11/2014  . Pure hypercholesterolemia 06/11/2014  . Arthralgia of hip 02/06/2014  . H/O total hip arthroplasty 02/06/2014  . History of artificial joint 02/06/2014  . Adiposity 01/21/2014  . Idiopathic Parkinson's disease ([place]) 01/21/2014  . Parkinson's disease ([place]) 01/21/2014  . BP (high blood pressure) 12/23/2013  . Edema, peripheral 12/23/2013  . Accumulation of fluid in tissues 12/23/2013  . Essential (primary) hypertension 12/23/2013  . Edema 12/23/2013  . Confusion state 10/17/2013  . Disassociation disorder 10/17/2013  . Anxiety 09/13/2013  . Clinical depression 09/13/2013  . Disordered sleep 09/13/2013  . Major depressive disorder with single episode 09/13/2013  . Tremor 01/28/2013     Past Surgical History:  Procedure Laterality Date  . SQUAMOUS CELL CARCINOMA EXCISION    . TOTAL HIP ARTHROPLASTY     bilateral  . veins stripping       Prior to Admission medications   Medication Sig Start Date End Date Taking? Authorizing Provider  allopurinol (ZYLOPRIM) 100 MG tablet TK 1 T PO QD 02/11/15   [provider]  aspirin 325 MG tablet Take 325 mg by mouth daily.    [provider]  benztropine (COGENTIN) 0.5 MG tablet TK 1 T PO Q NIGHT 02/26/15   [provider]  bumetanide (BUMEX) 1 MG tablet TK 1 T PO QD 10/13/15    [provider]  carbidopa-levodopa (SINEMET IR) 25-250 MG tablet TK 1 T PO QID 09/30/15   [provider]  Cyanocobalamin (RA VITAMIN B-12 TR) 1000 MCG TBCR Take by mouth.    [provider]  donepezil (ARICEPT) 5 MG tablet Take by mouth. 08/25/15 08/24/16  [provider]  donepezil (ARICEPT) 5 MG tablet Take by mouth. 12/16/16 12/16/17  [provider]  hydrochlorothiazide (HYDRODIURIL) 25 MG tablet 25 mg once daily. Taking 1/2 tablet 12/06/16   [provider]  lisinopril (PRINIVIL,ZESTRIL) 20 MG tablet Take 20 mg by mouth daily.    [provider]  Melatonin 10 MG TABS Take by mouth.    [provider]  meloxicam (MOBIC) 7.5 MG tablet Reported on 10/22/2015 10/13/15   [provider]  memantine (NAMENDA) 5 MG tablet Reported on 10/22/2015 10/15/14   [provider]  mirabegron ER (MYRBETRIQ) 50 MG TB24 tablet Take 1 tablet (50 mg total) by mouth daily. Patient not taking: Reported on 01/13/2017 10/27/15   Nickie Retort, MD  mirabegron ER (MYRBETRIQ) 50 MG TB24 tablet Take 1 tablet (50 mg total) by mouth daily. Patient not taking: Reported on 01/13/2017 01/14/16   Nickie Retort, MD  Multiple Vitamin (MULTI-VITAMINS) TABS Take by mouth.    [provider]  MYRBETRIQ 50 MG TB24 tablet Reported on 10/22/2015 10/03/14   [provider]  nadolol (CORGARD) 80 MG tablet 1 1/2 bid 12/16/12   [provider]  Omega-3 Fatty Acids (FISH OIL PO) Take by mouth.    [provider]  pantoprazole (PROTONIX) 40 MG tablet TK 1 T PO QD 11/09/14   [provider]  senna (SENOKOT) 8.6 MG TABS tablet Take by mouth.    [provider]  sertraline (ZOLOFT) 100 MG tablet Take 1 tablet (100 mg total) by mouth daily. 06/25/15   Marjie Skiff, MD  solifenacin (VESICARE) 5 MG tablet Take 1 tablet (5 mg total) by mouth daily. 05/25/16   Nickie Retort, MD  tadalafil (CIALIS) 20  MG tablet Reported on 10/22/2015 10/03/14   [provider]  traZODone (DESYREL) 100 MG tablet TK 1 TO 2 TS PO HS 05/09/16   [provider]     Allergies Patient has no known allergies.   Family History  Problem Relation Age of Onset  . Diabetes Father   . Heart attack Father   . Alzheimer's disease Mother   . Cancer - Ovarian Mother   . Leukemia Mother   . Breast cancer Sister   . Anxiety disorder Sister   . Depression Sister   . Headache Sister   . Prostate cancer Neg Hx   . Kidney cancer Neg Hx   . Bladder Cancer Neg Hx     Social History Social History  Substance Use Topics  . Smoking status: Former Smoker    Packs/day: 0.50    Types: Cigarettes, Cigars    Quit date: 01/28/1978  . Smokeless tobacco: Never Used     Comment: occasional cigar not had any in 3-4 years.  . Alcohol use 9.6 oz/week    8 Glasses of wine, 8 Cans of beer per week  Comment: 2-3 beers nightly, occasional wine.    Review of Systems  Constitutional:   No fever or chills.  ENT:   No sore throat. No rhinorrhea. Cardiovascular:   No chest pain or syncope. Respiratory:   No dyspnea or cough. Gastrointestinal:   Negative for abdominal pain, vomiting and diarrhea.  Musculoskeletal:   Negative for focal pain or swelling All other systems reviewed and are negative except as documented above in ROS and HPI.  ____________________________________________   PHYSICAL EXAM:  VITAL SIGNS: ED Triage Vitals  Enc Vitals Group     BP 02/24/17 1201 (!) 145/69     Pulse Rate 02/24/17 1201 (!) 51     Resp 02/24/17 1201 18     Temp 02/24/17 1201 98.2 F (36.8 C)     Temp Source 02/24/17 1201 Oral     SpO2 02/24/17 1201 99 %     Weight 02/24/17 1202 229 lb (103.9 kg)     Height 02/24/17 1202 5\' 10"  (1.778 m)     Head Circumference --      Peak Flow --      Pain Score 02/24/17 1209 8     Pain Loc --      Pain Edu? --      Excl. in [facility]? --     Vital signs reviewed, nursing  assessments reviewed.   Constitutional:   Alert and oriented. Well appearing and in no distress. Eyes:   No scleral icterus.  EOMI. No nystagmus. No conjunctival pallor. PERRL. ENT   Head:   Normocephalic and atraumatic.   Nose:   No congestion/rhinnorhea.    Mouth/Throat:   MMM, no pharyngeal erythema. No peritonsillar mass.    Neck:   No meningismus. Full ROM Hematological/Lymphatic/Immunilogical:   No cervical lymphadenopathy. Cardiovascular:   RRR. Symmetric bilateral radial and DP pulses.  No murmurs.  Respiratory:   Normal respiratory effort without tachypnea/retractions. Breath sounds are clear and equal bilaterally. No wheezes/rales/rhonchi. Gastrointestinal:   Soft and nontender. Non distended. There is no CVA tenderness.  No rebound, rigidity, or guarding. Genitourinary:   deferred Musculoskeletal:   Normal range of motion in all extremities. No joint effusions.  No lower extremity tenderness.  No edema. Neurologic:   Normal speech and language.  Motor grossly intact. Normal cerebellar function. Cranial nerves III through XII intact No gross focal neurologic deficits are appreciated.  Skin:    Skin is warm, dry and intact. No rash noted.  No petechiae, purpura, or bullae.  ____________________________________________    LABS (pertinent positives/negatives) (all labs ordered are listed, but only abnormal results are displayed) Labs Reviewed  COMPREHENSIVE METABOLIC PANEL - Abnormal; Notable for the following:       Result Value   Chloride 100 (*)    Glucose, Bld 111 (*)    All other components within normal limits  LIPASE, BLOOD  CBC  TROPONIN I  URINALYSIS, COMPLETE (UACMP) WITH MICROSCOPIC   ____________________________________________   EKG  Interpreted by me Sinus bradycardia rate of 51, normal axis and was just ST segments and T waves  ____________________________________________    RADIOLOGY  No results  found.  ____________________________________________   PROCEDURES Procedures  ____________________________________________   INITIAL IMPRESSION / ASSESSMENT AND PLAN / ED COURSE  Pertinent labs & imaging results that were available during my care of the patient were reviewed by me and considered in my medical decision making (see chart for details).  Patient well appearing no acute distress, presents with episode of dizziness, appears  to be an exacerbation of his underlying vertigo disorder as it was rapid onset, quickly resolved, now asymptomatic. Likely provoked by side effects of Seroquel. He is artery spoken with his psychiatrist which has instructed him to discontinue the Seroquel and restart the trazodone. Advised to follow-up with his psychiatrist and primary care. Labs in the ED are unremarkable, vital signs and exam are unremarkable. Low suspicion of stroke intracranial hemorrhage meningitis encephalitis ACS PE dissection AAA or sepsis.      ____________________________________________   FINAL CLINICAL IMPRESSION(S) / ED DIAGNOSES  Final diagnoses:  Dizziness      New Prescriptions   No medications on file     Portions of this note were generated with dragon dictation software. Dictation errors may occur despite best attempts at proofreading.    Carrie Mew, MD 02/24/17 1500

## 2017-02-24 NOTE — ED Triage Notes (Signed)
Pt brought from [facility] , pt has been vomiting for 24 hours, pt reports starting a new medication 2 days ago

## 2017-02-24 NOTE — ED Notes (Signed)
Pt right carotid is pulsating lying down

## 2017-02-27 DIAGNOSIS — G20 Parkinson's disease: Secondary | ICD-10-CM | POA: Diagnosis not present

## 2017-02-27 DIAGNOSIS — R2681 Unsteadiness on feet: Secondary | ICD-10-CM | POA: Diagnosis not present

## 2017-02-27 DIAGNOSIS — R296 Repeated falls: Secondary | ICD-10-CM | POA: Diagnosis not present

## 2017-02-27 DIAGNOSIS — R262 Difficulty in walking, not elsewhere classified: Secondary | ICD-10-CM | POA: Diagnosis not present

## 2017-02-27 DIAGNOSIS — R293 Abnormal posture: Secondary | ICD-10-CM | POA: Diagnosis not present

## 2017-03-02 DIAGNOSIS — R2681 Unsteadiness on feet: Secondary | ICD-10-CM | POA: Diagnosis not present

## 2017-03-02 DIAGNOSIS — R262 Difficulty in walking, not elsewhere classified: Secondary | ICD-10-CM | POA: Diagnosis not present

## 2017-03-02 DIAGNOSIS — R293 Abnormal posture: Secondary | ICD-10-CM | POA: Diagnosis not present

## 2017-03-02 DIAGNOSIS — R296 Repeated falls: Secondary | ICD-10-CM | POA: Diagnosis not present

## 2017-03-02 DIAGNOSIS — G20 Parkinson's disease: Secondary | ICD-10-CM | POA: Diagnosis not present

## 2017-03-06 DIAGNOSIS — R296 Repeated falls: Secondary | ICD-10-CM | POA: Diagnosis not present

## 2017-03-06 DIAGNOSIS — R2681 Unsteadiness on feet: Secondary | ICD-10-CM | POA: Diagnosis not present

## 2017-03-06 DIAGNOSIS — R262 Difficulty in walking, not elsewhere classified: Secondary | ICD-10-CM | POA: Diagnosis not present

## 2017-03-06 DIAGNOSIS — R293 Abnormal posture: Secondary | ICD-10-CM | POA: Diagnosis not present

## 2017-03-06 DIAGNOSIS — G20 Parkinson's disease: Secondary | ICD-10-CM | POA: Diagnosis not present

## 2017-03-07 DIAGNOSIS — M47816 Spondylosis without myelopathy or radiculopathy, lumbar region: Secondary | ICD-10-CM | POA: Diagnosis not present

## 2017-03-07 DIAGNOSIS — M5489 Other dorsalgia: Secondary | ICD-10-CM | POA: Diagnosis not present

## 2017-03-08 DIAGNOSIS — R293 Abnormal posture: Secondary | ICD-10-CM | POA: Diagnosis not present

## 2017-03-08 DIAGNOSIS — R296 Repeated falls: Secondary | ICD-10-CM | POA: Diagnosis not present

## 2017-03-08 DIAGNOSIS — G20 Parkinson's disease: Secondary | ICD-10-CM | POA: Diagnosis not present

## 2017-03-08 DIAGNOSIS — R2681 Unsteadiness on feet: Secondary | ICD-10-CM | POA: Diagnosis not present

## 2017-03-08 DIAGNOSIS — R262 Difficulty in walking, not elsewhere classified: Secondary | ICD-10-CM | POA: Diagnosis not present

## 2017-03-15 DIAGNOSIS — G20 Parkinson's disease: Secondary | ICD-10-CM | POA: Diagnosis not present

## 2017-03-15 DIAGNOSIS — R262 Difficulty in walking, not elsewhere classified: Secondary | ICD-10-CM | POA: Diagnosis not present

## 2017-03-15 DIAGNOSIS — R296 Repeated falls: Secondary | ICD-10-CM | POA: Diagnosis not present

## 2017-03-15 DIAGNOSIS — R293 Abnormal posture: Secondary | ICD-10-CM | POA: Diagnosis not present

## 2017-03-15 DIAGNOSIS — R2681 Unsteadiness on feet: Secondary | ICD-10-CM | POA: Diagnosis not present

## 2017-03-22 DIAGNOSIS — R262 Difficulty in walking, not elsewhere classified: Secondary | ICD-10-CM | POA: Diagnosis not present

## 2017-03-22 DIAGNOSIS — R2681 Unsteadiness on feet: Secondary | ICD-10-CM | POA: Diagnosis not present

## 2017-03-22 DIAGNOSIS — G20 Parkinson's disease: Secondary | ICD-10-CM | POA: Diagnosis not present

## 2017-03-22 DIAGNOSIS — R293 Abnormal posture: Secondary | ICD-10-CM | POA: Diagnosis not present

## 2017-03-22 DIAGNOSIS — R296 Repeated falls: Secondary | ICD-10-CM | POA: Diagnosis not present

## 2017-03-29 DIAGNOSIS — R296 Repeated falls: Secondary | ICD-10-CM | POA: Diagnosis not present

## 2017-03-29 DIAGNOSIS — R293 Abnormal posture: Secondary | ICD-10-CM | POA: Diagnosis not present

## 2017-03-29 DIAGNOSIS — R2681 Unsteadiness on feet: Secondary | ICD-10-CM | POA: Diagnosis not present

## 2017-03-29 DIAGNOSIS — G20 Parkinson's disease: Secondary | ICD-10-CM | POA: Diagnosis not present

## 2017-03-29 DIAGNOSIS — R262 Difficulty in walking, not elsewhere classified: Secondary | ICD-10-CM | POA: Diagnosis not present

## 2017-03-30 ENCOUNTER — Encounter: Payer: Self-pay | Admitting: Urology

## 2017-03-30 ENCOUNTER — Ambulatory Visit (INDEPENDENT_AMBULATORY_CARE_PROVIDER_SITE_OTHER): Payer: PPO | Admitting: Urology

## 2017-03-30 VITALS — BP 149/80 | HR 65 | Ht 70.0 in | Wt 226.2 lb

## 2017-03-30 DIAGNOSIS — N529 Male erectile dysfunction, unspecified: Secondary | ICD-10-CM

## 2017-03-30 DIAGNOSIS — N3281 Overactive bladder: Secondary | ICD-10-CM | POA: Diagnosis not present

## 2017-03-30 MED ORDER — SILDENAFIL CITRATE 20 MG PO TABS: ORAL_TABLET | ORAL | 3 refills | Status: DC

## 2017-03-30 NOTE — Progress Notes (Signed)
03/30/2017 2:31 PM   Christian Anderson. [birth year] [id number]  Referring provider: Idelle Crouch, MD [facility] [place], [place] [address]  Chief Complaint  Patient presents with  . Erectile Dysfunction    HPI: The patient is a 66 year old gentleman who presents today for follow-up.   1. Elevated PSA - PSA was 6.07 in April 2017. It was 3.86 two years prior. He does have Parkinson's disease and a fair amount of other medical problems. Pt chose surveillance. His PSA declined August 2017 to3.2. It was 3.0 in August 2018 with normal DRE.  2. Urinary frequency/urgency He was on Myrbetriq 50 mg daily.His urgency and urinary frequency is completely resolved on this medication. He is very happy with this medication, but insurance didn't cover so he switched to [employer].  Unfortunately he is now in the donut hole portion of his insurance until the end of the year.  His Vesicare is currently unaffordable as well.  3.  ED The patient presents today with new complaint of erectile dysfunction.  He is unable to obtain an erection at this time.  He has tried Viagra in the distant past without much success.  He is also tried Cialis.  No nitrates.  No cardiac issues.     PMH: Past Medical History:  Diagnosis Date  . Anxiety   . Depression   . Hyperlipemia   . Hypertension   . Tremor     Surgical History: Past Surgical History:  Procedure Laterality Date  . SQUAMOUS CELL CARCINOMA EXCISION    . TOTAL HIP ARTHROPLASTY     bilateral  . veins stripping      Home Medications:  Allergies as of 03/30/2017   No Known Allergies     Medication List       Accurate as of 03/30/17  2:31 PM. Always use your most recent med list.          allopurinol 100 MG tablet Commonly known as:  ZYLOPRIM TK 1 T PO QD   aspirin 325 MG tablet Take 325 mg by mouth daily.   benztropine 0.5 MG tablet Commonly known as:  COGENTIN TK 1 T PO Q NIGHT     bumetanide 1 MG tablet Commonly known as:  BUMEX TK 1 T PO QD   carbidopa-levodopa 25-250 MG tablet Commonly known as:  SINEMET IR TK 1 T PO QID   CIALIS 20 MG tablet Generic drug:  tadalafil Reported on 10/22/2015   donepezil 10 MG tablet Commonly known as:  ARICEPT Take 10 mg by mouth at bedtime.   FISH OIL PO Take by mouth.   hydrochlorothiazide 25 MG tablet Commonly known as:  HYDRODIURIL 25 mg once daily. Taking 1/2 tablet   lisinopril 20 MG tablet Commonly known as:  PRINIVIL,ZESTRIL Take 20 mg by mouth daily.   Melatonin 10 MG Tabs Take by mouth.   memantine 5 MG tablet Commonly known as:  NAMENDA Reported on 10/22/2015   MULTI-VITAMINS Tabs Take by mouth.   nadolol 80 MG tablet Commonly known as:  CORGARD 1 1/2 bid   pantoprazole 40 MG tablet Commonly known as:  PROTONIX TK 1 T PO QD   RA VITAMIN B-12 TR 1000 MCG Tbcr Generic drug:  Cyanocobalamin Take by mouth.   senna 8.6 MG Tabs tablet Commonly known as:  SENOKOT Take by mouth.   sertraline 100 MG tablet Commonly known as:  ZOLOFT Take 1 tablet (100 mg total) by mouth daily.  sildenafil 20 MG tablet Commonly known as:  REVATIO Take 1 to 5 tabs PO daily prn   solifenacin 5 MG tablet Commonly known as:  VESICARE Take 1 tablet (5 mg total) by mouth daily.   traZODone 100 MG tablet Commonly known as:  DESYREL TK 1 TO 2 TS PO HS       Allergies: No Known Allergies  Family History: Family History  Problem Relation Age of Onset  . Diabetes Father   . Heart attack Father   . Alzheimer's disease Mother   . Cancer - Ovarian Mother   . Leukemia Mother   . Breast cancer Sister   . Anxiety disorder Sister   . Depression Sister   . Headache Sister   . Prostate cancer Neg Hx   . Kidney cancer Neg Hx   . Bladder Cancer Neg Hx     Social History:  reports that he quit smoking about 39 years ago. His smoking use included Cigarettes and Cigars. He smoked 0.50 packs per day. He has  never used smokeless tobacco. He reports that he drinks about 9.6 oz of alcohol per week . He reports that he does not use drugs.  ROS: UROLOGY Frequent Urination?: No Hard to postpone urination?: Yes Burning/pain with urination?: No Get up at night to urinate?: Yes Leakage of urine?: Yes Urine stream starts and stops?: No Trouble starting stream?: No Do you have to strain to urinate?: No Blood in urine?: No Urinary tract infection?: No Sexually transmitted disease?: No Injury to kidneys or bladder?: No Painful intercourse?: No Weak stream?: No Erection problems?: Yes Penile pain?: No  Gastrointestinal Nausea?: No Vomiting?: No Indigestion/heartburn?: No Diarrhea?: No Constipation?: No  Constitutional Fever: No Night sweats?: No Weight loss?: No Fatigue?: Yes  Skin Skin rash/lesions?: No Itching?: No  Eyes Blurred vision?: No Double vision?: No  Ears/Nose/Throat Sore throat?: No Sinus problems?: No  Hematologic/Lymphatic Swollen glands?: No Easy bruising?: No  Cardiovascular Leg swelling?: No Chest pain?: No  Respiratory Cough?: No Shortness of breath?: No  Endocrine Excessive thirst?: No  Musculoskeletal Back pain?: Yes Joint pain?: Yes  Neurological Headaches?: No Dizziness?: Yes  Psychologic Depression?: Yes Anxiety?: Yes  Physical Exam: BP (!) 149/80 (BP Location: Right Arm, Patient Position: Sitting, Cuff Size: Normal)   Pulse 65   Ht 5\' 10"  (1.778 m)   Wt 226 lb 3.2 oz (102.6 kg)   BMI 32.46 kg/m   Constitutional:  Alert and oriented, No acute distress. HEENT: [place] AT, moist mucus membranes.  Trachea midline, no masses. Cardiovascular: No clubbing, cyanosis, or edema. Respiratory: Normal respiratory effort, no increased work of breathing. GI: Abdomen is soft, nontender, nondistended, no abdominal masses GU: No CVA tenderness.   Skin: No rashes, bruises or suspicious lesions. Lymph: No cervical or inguinal  adenopathy. Neurologic: Grossly intact, no focal deficits, moving all 4 extremities. Psychiatric: Normal mood and affect.  Laboratory Data: Lab Results  Component Value Date   WBC 9.8 02/24/2017   HGB 14.5 02/24/2017   HCT 42.3 02/24/2017   MCV 90.1 02/24/2017   PLT 191 02/24/2017    Lab Results  Component Value Date   CREATININE 0.73 02/24/2017    No results found for: PSA  No results found for: TESTOSTERONE  No results found for: HGBA1C  Urinalysis    Component Value Date/Time   COLORURINE Yellow 10/07/2013 1333   APPEARANCEUR Clear 01/13/2017 1019   LABSPEC 1.012 10/07/2013 1333   PHURINE 7.0 10/07/2013 1333   GLUCOSEU Negative 01/13/2017  [place] Negative 10/07/2013 1333   HGBUR Negative 10/07/2013 1333   BILIRUBINUR Negative 01/13/2017 1019   BILIRUBINUR Negative 10/07/2013 1333   KETONESUR Negative 10/07/2013 1333   PROTEINUR Negative 01/13/2017 1019   PROTEINUR Negative 10/07/2013 1333   NITRITE Negative 01/13/2017 1019   NITRITE Negative 10/07/2013 1333   LEUKOCYTESUR Negative 01/13/2017 1019   LEUKOCYTESUR Negative 10/07/2013 1333    Assessment & Plan:   1.  Urinary frequency/urgency Continue Vesicare.  The patient was given 2 months of Myrbetriq 50 mg samples to get him through the end of the year when his vesicare will again be covered.  2.  Erectile dysfunction I discussed treatment options with the patient's regarding his ED.  We discussed trying Viagra again since it has been a number of years, though he was warned that there is low likelihood for success given his previous results using PDE5 inhibitors.  We also discussed a vacuum erection device, MUSE, and trimix.  He would like to try generic sildenafil 1 more time.  We will arrange for him to obtain this medication.  He will follow-up in 6 weeks.  We did discuss in detail penile injection therapy which she is interested in pursuing if  the sildenafil does not work.  We will follow-up in 6  weeks to assess his progress.  He was warned of the risk of priapism and the need for emergent intervention with all the above medications.  3.  Prostate cancer screening Up-to-date.  Repeat PSA and DRE in 1 year.  Return in about 6 weeks (around 05/11/2017).  Nickie Retort, MD  [facility] [address] [place], [place] [address] [phone]

## 2017-04-03 DIAGNOSIS — F331 Major depressive disorder, recurrent, moderate: Secondary | ICD-10-CM | POA: Diagnosis not present

## 2017-04-03 DIAGNOSIS — F0280 Dementia in other diseases classified elsewhere without behavioral disturbance: Secondary | ICD-10-CM | POA: Diagnosis not present

## 2017-04-03 DIAGNOSIS — F411 Generalized anxiety disorder: Secondary | ICD-10-CM | POA: Diagnosis not present

## 2017-04-03 DIAGNOSIS — Z79899 Other long term (current) drug therapy: Secondary | ICD-10-CM | POA: Diagnosis not present

## 2017-04-05 DIAGNOSIS — G20 Parkinson's disease: Secondary | ICD-10-CM | POA: Diagnosis not present

## 2017-04-05 DIAGNOSIS — R262 Difficulty in walking, not elsewhere classified: Secondary | ICD-10-CM | POA: Diagnosis not present

## 2017-04-05 DIAGNOSIS — R296 Repeated falls: Secondary | ICD-10-CM | POA: Diagnosis not present

## 2017-04-05 DIAGNOSIS — R2681 Unsteadiness on feet: Secondary | ICD-10-CM | POA: Diagnosis not present

## 2017-04-05 DIAGNOSIS — R293 Abnormal posture: Secondary | ICD-10-CM | POA: Diagnosis not present

## 2017-04-12 DIAGNOSIS — G20 Parkinson's disease: Secondary | ICD-10-CM | POA: Diagnosis not present

## 2017-04-12 DIAGNOSIS — R296 Repeated falls: Secondary | ICD-10-CM | POA: Diagnosis not present

## 2017-04-12 DIAGNOSIS — R2681 Unsteadiness on feet: Secondary | ICD-10-CM | POA: Diagnosis not present

## 2017-04-12 DIAGNOSIS — R262 Difficulty in walking, not elsewhere classified: Secondary | ICD-10-CM | POA: Diagnosis not present

## 2017-04-12 DIAGNOSIS — R293 Abnormal posture: Secondary | ICD-10-CM | POA: Diagnosis not present

## 2017-04-12 DIAGNOSIS — Z23 Encounter for immunization: Secondary | ICD-10-CM | POA: Diagnosis not present

## 2017-04-18 DIAGNOSIS — R296 Repeated falls: Secondary | ICD-10-CM | POA: Diagnosis not present

## 2017-04-18 DIAGNOSIS — G20 Parkinson's disease: Secondary | ICD-10-CM | POA: Diagnosis not present

## 2017-04-18 DIAGNOSIS — R262 Difficulty in walking, not elsewhere classified: Secondary | ICD-10-CM | POA: Diagnosis not present

## 2017-04-18 DIAGNOSIS — R2681 Unsteadiness on feet: Secondary | ICD-10-CM | POA: Diagnosis not present

## 2017-04-18 DIAGNOSIS — R293 Abnormal posture: Secondary | ICD-10-CM | POA: Diagnosis not present

## 2017-04-24 DIAGNOSIS — H903 Sensorineural hearing loss, bilateral: Secondary | ICD-10-CM | POA: Diagnosis not present

## 2017-04-24 DIAGNOSIS — R42 Dizziness and giddiness: Secondary | ICD-10-CM | POA: Diagnosis not present

## 2017-04-26 DIAGNOSIS — R296 Repeated falls: Secondary | ICD-10-CM | POA: Diagnosis not present

## 2017-04-26 DIAGNOSIS — G20 Parkinson's disease: Secondary | ICD-10-CM | POA: Diagnosis not present

## 2017-04-26 DIAGNOSIS — R262 Difficulty in walking, not elsewhere classified: Secondary | ICD-10-CM | POA: Diagnosis not present

## 2017-04-26 DIAGNOSIS — R2681 Unsteadiness on feet: Secondary | ICD-10-CM | POA: Diagnosis not present

## 2017-04-26 DIAGNOSIS — R293 Abnormal posture: Secondary | ICD-10-CM | POA: Diagnosis not present

## 2017-05-04 DIAGNOSIS — R296 Repeated falls: Secondary | ICD-10-CM | POA: Diagnosis not present

## 2017-05-04 DIAGNOSIS — R262 Difficulty in walking, not elsewhere classified: Secondary | ICD-10-CM | POA: Diagnosis not present

## 2017-05-04 DIAGNOSIS — R2681 Unsteadiness on feet: Secondary | ICD-10-CM | POA: Diagnosis not present

## 2017-05-04 DIAGNOSIS — G20 Parkinson's disease: Secondary | ICD-10-CM | POA: Diagnosis not present

## 2017-05-04 DIAGNOSIS — R293 Abnormal posture: Secondary | ICD-10-CM | POA: Diagnosis not present

## 2017-05-11 ENCOUNTER — Ambulatory Visit: Payer: Self-pay

## 2017-05-19 DIAGNOSIS — F411 Generalized anxiety disorder: Secondary | ICD-10-CM | POA: Diagnosis not present

## 2017-05-19 DIAGNOSIS — G20 Parkinson's disease: Secondary | ICD-10-CM | POA: Diagnosis not present

## 2017-05-19 DIAGNOSIS — E7800 Pure hypercholesterolemia, unspecified: Secondary | ICD-10-CM | POA: Diagnosis not present

## 2017-05-19 DIAGNOSIS — R232 Flushing: Secondary | ICD-10-CM | POA: Diagnosis not present

## 2017-05-19 DIAGNOSIS — M109 Gout, unspecified: Secondary | ICD-10-CM | POA: Diagnosis not present

## 2017-05-19 DIAGNOSIS — I10 Essential (primary) hypertension: Secondary | ICD-10-CM | POA: Diagnosis not present

## 2017-06-12 DIAGNOSIS — R232 Flushing: Secondary | ICD-10-CM | POA: Diagnosis not present

## 2017-06-14 ENCOUNTER — Other Ambulatory Visit: Payer: Self-pay

## 2017-06-14 DIAGNOSIS — N3281 Overactive bladder: Secondary | ICD-10-CM

## 2017-06-14 MED ORDER — SOLIFENACIN SUCCINATE 5 MG PO TABS: 5.0000 mg | ORAL_TABLET | Freq: Every day | ORAL | 11 refills | Status: DC

## 2017-06-15 ENCOUNTER — Ambulatory Visit: Payer: PPO | Admitting: Urology

## 2017-06-15 ENCOUNTER — Encounter: Payer: Self-pay | Admitting: Urology

## 2017-06-15 VITALS — BP 106/70 | HR 64 | Ht 70.0 in | Wt 227.5 lb

## 2017-06-15 DIAGNOSIS — N529 Male erectile dysfunction, unspecified: Secondary | ICD-10-CM | POA: Diagnosis not present

## 2017-06-15 DIAGNOSIS — N3281 Overactive bladder: Secondary | ICD-10-CM | POA: Diagnosis not present

## 2017-06-15 NOTE — Progress Notes (Signed)
06/15/2017 10:39 AM   Christian Anderson. 08/28/1950 [id number]  Referring provider: Idelle Crouch, MD [facility] [place], [place] [address]  Chief Complaint  Patient presents with  . Erectile Dysfunction    HPI: The patient is a 67 year old gentleman who presents today for follow-up.  1. Elevated PSA - PSA was 6.07 in April 2017. It was 3.86 two years prior. He does have Parkinson's disease and a fair amount of other medical problems. Pt chose surveillance. His PSA declined August 2017 to3.2. It was 3.0 in August 2018 with normal DRE.  2. Urinary frequency/urgency He was on Myrbetriq 50 mg daily.His urgency and urinary frequency is completely resolved on this medication. He is very happy with this medication, but insurance didn't cover so he switched to [employer].  He is currently on vesicare 5 mg.   3.  ED The patient presents today with new complaint of erectile dysfunction.  He is unable to obtain an erection at this time.  He has tried Viagra in the distant past without much success.  He is also tried Cialis.  No nitrates.  No cardiac issues.  He was started on generic sildenafil for this at his last visit.  Though he was warned this may not work for him as Viagra has not worked in the past.  Unfortunately, he had no significant results with 100 mg   PMH: Past Medical History:  Diagnosis Date  . Anxiety   . Depression   . Hyperlipemia   . Hypertension   . Tremor     Surgical History: Past Surgical History:  Procedure Laterality Date  . SQUAMOUS CELL CARCINOMA EXCISION    . TOTAL HIP ARTHROPLASTY     bilateral  . veins stripping      Home Medications:  Allergies as of 06/15/2017   No Known Allergies     Medication List        Accurate as of 06/15/17 10:39 AM. Always use your most recent med list.          allopurinol 100 MG tablet Commonly known as:  ZYLOPRIM TK 1 T PO QD   aspirin 325 MG tablet Take 325 mg by  mouth daily.   benztropine 0.5 MG tablet Commonly known as:  COGENTIN TK 1 T PO Q NIGHT   bumetanide 1 MG tablet Commonly known as:  BUMEX TK 1 T PO QD   carbidopa-levodopa 25-250 MG tablet Commonly known as:  SINEMET IR TK 1 T PO QID   donepezil 10 MG tablet Commonly known as:  ARICEPT Take 10 mg by mouth at bedtime.   FISH OIL PO Take by mouth.   hydrochlorothiazide 25 MG tablet Commonly known as:  HYDRODIURIL 25 mg once daily. Taking 1/2 tablet   lisinopril 20 MG tablet Commonly known as:  PRINIVIL,ZESTRIL Take 20 mg by mouth daily.   Melatonin 10 MG Tabs Take by mouth.   memantine 5 MG tablet Commonly known as:  NAMENDA Reported on 10/22/2015   MULTI-VITAMINS Tabs Take by mouth.   nadolol 80 MG tablet Commonly known as:  CORGARD 1 1/2 bid   pantoprazole 40 MG tablet Commonly known as:  PROTONIX TK 1 T PO QD   RA VITAMIN B-12 TR 1000 MCG Tbcr Generic drug:  Cyanocobalamin Take by mouth.   senna 8.6 MG Tabs tablet Commonly known as:  SENOKOT Take by mouth.   sertraline 100 MG tablet Commonly known as:  ZOLOFT Take 1 tablet (100  mg total) by mouth daily.   sildenafil 20 MG tablet Commonly known as:  REVATIO Take 1 to 5 tabs PO daily prn   solifenacin 5 MG tablet Commonly known as:  VESICARE Take 1 tablet (5 mg total) by mouth daily.   traZODone 100 MG tablet Commonly known as:  DESYREL TK 1 TO 2 TS PO HS       Allergies: No Known Allergies  Family History: Family History  Problem Relation Age of Onset  . Diabetes Father   . Heart attack Father   . Alzheimer's disease Mother   . Cancer - Ovarian Mother   . Leukemia Mother   . Breast cancer Sister   . Anxiety disorder Sister   . Depression Sister   . Headache Sister   . Prostate cancer Neg Hx   . Kidney cancer Neg Hx   . Bladder Cancer Neg Hx     Social History:  reports that he quit smoking about 39 years ago. His smoking use included cigarettes and cigars. He smoked 0.50  packs per day. he has never used smokeless tobacco. He reports that he drinks about 9.6 oz of alcohol per week. He reports that he does not use drugs.  ROS: UROLOGY Frequent Urination?: Yes Hard to postpone urination?: Yes Burning/pain with urination?: No Get up at night to urinate?: Yes Leakage of urine?: Yes Urine stream starts and stops?: No Trouble starting stream?: No Do you have to strain to urinate?: No Blood in urine?: No Urinary tract infection?: No Sexually transmitted disease?: No Injury to kidneys or bladder?: No Painful intercourse?: No Weak stream?: No Erection problems?: Yes Penile pain?: No  Gastrointestinal Nausea?: No Vomiting?: No Indigestion/heartburn?: No Diarrhea?: No Constipation?: No  Constitutional Fever: No Night sweats?: No Weight loss?: No Fatigue?: No  Skin Skin rash/lesions?: No Itching?: No  Eyes Blurred vision?: No Double vision?: No  Ears/Nose/Throat Sore throat?: No Sinus problems?: No  Hematologic/Lymphatic Swollen glands?: No Easy bruising?: No  Cardiovascular Leg swelling?: No Chest pain?: No  Respiratory Cough?: No Shortness of breath?: No  Endocrine Excessive thirst?: No  Musculoskeletal Back pain?: Yes Joint pain?: No  Neurological Headaches?: No Dizziness?: Yes  Psychologic Depression?: Yes Anxiety?: Yes  Physical Exam: BP 106/70 (BP Location: Right Arm, Patient Position: Sitting, Cuff Size: Large)   Pulse 64   Ht '5\' 10"'$  (1.778 m)   Wt 227 lb 8 oz (103.2 kg)   BMI 32.64 kg/m   Constitutional:  Alert and oriented, No acute distress. HEENT: [place] AT, moist mucus membranes.  Trachea midline, no masses. Cardiovascular: No clubbing, cyanosis, or edema. Respiratory: Normal respiratory effort, no increased work of breathing. GI: Abdomen is soft, nontender, nondistended, no abdominal masses GU: No CVA tenderness.  Skin: No rashes, bruises or suspicious lesions. Lymph: No cervical or inguinal  adenopathy. Neurologic: Grossly intact, no focal deficits, moving all 4 extremities. Psychiatric: Normal mood and affect.  Laboratory Data: Lab Results  Component Value Date   WBC 9.8 02/24/2017   HGB 14.5 02/24/2017   HCT 42.3 02/24/2017   MCV 90.1 02/24/2017   PLT 191 02/24/2017    Lab Results  Component Value Date   CREATININE 0.73 02/24/2017    No results found for: PSA  No results found for: TESTOSTERONE  No results found for: HGBA1C  Urinalysis    Component Value Date/Time   COLORURINE Yellow 10/07/2013 1333   APPEARANCEUR Clear 01/13/2017 1019   LABSPEC 1.012 10/07/2013 1333   PHURINE 7.0 10/07/2013 1333  GLUCOSEU Negative 01/13/2017 1019   GLUCOSEU Negative 10/07/2013 1333   HGBUR Negative 10/07/2013 1333   BILIRUBINUR Negative 01/13/2017 1019   BILIRUBINUR Negative 10/07/2013 1333   KETONESUR Negative 10/07/2013 1333   PROTEINUR Negative 01/13/2017 1019   PROTEINUR Negative 10/07/2013 1333   NITRITE Negative 01/13/2017 1019   NITRITE Negative 10/07/2013 1333   LEUKOCYTESUR Negative 01/13/2017 1019   LEUKOCYTESUR Negative 10/07/2013 1333    Assessment & Plan:    1.  Urinary frequency/urgency Continue Vesicare.    2.  Erectile dysfunction I discussed treatment options with the patient regarding his failed oral therapy for ED.  He has elected to pursue injection therapy with trimix.  He will obtain a test dose kit and return for teaching.  He was warned of the risk of priapism and the need for emergent intervention.  3.  Prostate cancer screening Up-to-date.  Repeat PSA and DRE in 1 year.  Return for early AM appt for trimix teaching.  Nickie Retort, MD  [facility] [address] [place], [place] [address] [phone]

## 2017-06-22 DIAGNOSIS — X32XXXA Exposure to sunlight, initial encounter: Secondary | ICD-10-CM | POA: Diagnosis not present

## 2017-06-22 DIAGNOSIS — D225 Melanocytic nevi of trunk: Secondary | ICD-10-CM | POA: Diagnosis not present

## 2017-06-22 DIAGNOSIS — L218 Other seborrheic dermatitis: Secondary | ICD-10-CM | POA: Diagnosis not present

## 2017-06-22 DIAGNOSIS — D2261 Melanocytic nevi of right upper limb, including shoulder: Secondary | ICD-10-CM | POA: Diagnosis not present

## 2017-06-22 DIAGNOSIS — L821 Other seborrheic keratosis: Secondary | ICD-10-CM | POA: Diagnosis not present

## 2017-06-22 DIAGNOSIS — L570 Actinic keratosis: Secondary | ICD-10-CM | POA: Diagnosis not present

## 2017-06-23 ENCOUNTER — Encounter: Payer: Self-pay | Admitting: Urology

## 2017-06-23 ENCOUNTER — Ambulatory Visit: Payer: PPO | Admitting: Urology

## 2017-06-23 VITALS — BP 112/72 | HR 66 | Ht 70.0 in | Wt 227.0 lb

## 2017-06-23 DIAGNOSIS — N529 Male erectile dysfunction, unspecified: Secondary | ICD-10-CM

## 2017-06-23 DIAGNOSIS — Z125 Encounter for screening for malignant neoplasm of prostate: Secondary | ICD-10-CM | POA: Diagnosis not present

## 2017-06-23 NOTE — Progress Notes (Signed)
06/23/2017 9:10 AM   Christian Anderson. Jun 16, 1950 [id number]  Referring provider: Idelle Crouch, MD [facility] [place], [place] [address]  Chief Complaint  Patient presents with  . Other    trimix teaching     HPI: The patient is a 67 year old gentleman who presents today for follow-up.  1. Elevated PSA - PSA was 6.07 in April 2017. It was 3.86 two years prior. He does have Parkinson's disease and a fair amount of other medical problems. Pt chose surveillance. His PSA declined August 2017 to3.2.It was 3.0 in August 2018 with normal DRE.  2. Urinary frequency/urgency He wason Myrbetriq 50 mg daily.His urgency and urinary frequency is completely resolved on this medication. He is very happy with this medication, but insurance didn't cover so he switched to [employer]. He is currently on vesicare 5 mg with good success.  3. ED The patient presents today with new complaint of erectile dysfunction. He is unable to obtain an erection at this time. He has tried Viagra in the distant past without much success. He has also tried Cialis. No nitrates. No cardiac issues.  Since he has failed oral medications, he presents today for penile injection therapy teaching with trimix.       PMH: Past Medical History:  Diagnosis Date  . Anxiety   . Depression   . Hyperlipemia   . Hypertension   . Tremor     Surgical History: Past Surgical History:  Procedure Laterality Date  . SQUAMOUS CELL CARCINOMA EXCISION    . TOTAL HIP ARTHROPLASTY     bilateral  . veins stripping      Home Medications:  Allergies as of 06/23/2017   No Known Allergies     Medication List        Accurate as of 06/23/17  9:10 AM. Always use your most recent med list.          allopurinol 100 MG tablet Commonly known as:  ZYLOPRIM TK 1 T PO QD   aspirin 325 MG tablet Take 325 mg by mouth daily.   benztropine 0.5 MG tablet Commonly known as:   COGENTIN TK 1 T PO Q NIGHT   carbidopa-levodopa 25-250 MG tablet Commonly known as:  SINEMET IR TK 1 T PO QID   donepezil 10 MG tablet Commonly known as:  ARICEPT Take 10 mg by mouth at bedtime.   FISH OIL PO Take by mouth.   lisinopril 20 MG tablet Commonly known as:  PRINIVIL,ZESTRIL Take 20 mg by mouth daily.   Melatonin 10 MG Tabs Take by mouth.   memantine 5 MG tablet Commonly known as:  NAMENDA Reported on 10/22/2015   MULTI-VITAMINS Tabs Take by mouth.   nadolol 80 MG tablet Commonly known as:  CORGARD 1 1/2 bid   pantoprazole 40 MG tablet Commonly known as:  PROTONIX TK 1 T PO QD   RA VITAMIN B-12 TR 1000 MCG Tbcr Generic drug:  Cyanocobalamin Take by mouth.   senna 8.6 MG Tabs tablet Commonly known as:  SENOKOT Take by mouth.   sertraline 100 MG tablet Commonly known as:  ZOLOFT Take 1 tablet (100 mg total) by mouth daily.   solifenacin 5 MG tablet Commonly known as:  VESICARE Take 1 tablet (5 mg total) by mouth daily.   traZODone 100 MG tablet Commonly known as:  DESYREL TK 1 TO 2 TS PO HS       Allergies: No Known Allergies  Family History:  Family History  Problem Relation Age of Onset  . Diabetes Father   . Heart attack Father   . Alzheimer's disease Mother   . Cancer - Ovarian Mother   . Leukemia Mother   . Breast cancer Sister   . Anxiety disorder Sister   . Depression Sister   . Headache Sister   . Prostate cancer Neg Hx   . Kidney cancer Neg Hx   . Bladder Cancer Neg Hx     Social History:  reports that he quit smoking about 39 years ago. His smoking use included cigarettes and cigars. He smoked 0.50 packs per day. he has never used smokeless tobacco. He reports that he drinks about 9.6 oz of alcohol per week. He reports that he does not use drugs.  ROS: UROLOGY Frequent Urination?: No Hard to postpone urination?: Yes Burning/pain with urination?: No Get up at night to urinate?: No Leakage of urine?: Yes Urine  stream starts and stops?: No Trouble starting stream?: No Do you have to strain to urinate?: No Blood in urine?: No Urinary tract infection?: No Sexually transmitted disease?: No Injury to kidneys or bladder?: No Painful intercourse?: No Weak stream?: No Erection problems?: Yes Penile pain?: No  Gastrointestinal Nausea?: No Vomiting?: No Indigestion/heartburn?: No Diarrhea?: No Constipation?: No  Constitutional Fever: No Night sweats?: No Weight loss?: No Fatigue?: Yes  Skin Skin rash/lesions?: No Itching?: No  Eyes Blurred vision?: Yes Double vision?: No  Ears/Nose/Throat Sore throat?: No Sinus problems?: No  Hematologic/Lymphatic Swollen glands?: No Easy bruising?: No  Cardiovascular Leg swelling?: No Chest pain?: No  Respiratory Cough?: No Shortness of breath?: No  Endocrine Excessive thirst?: Yes  Musculoskeletal Back pain?: Yes Joint pain?: Yes  Neurological Headaches?: No Dizziness?: Yes  Psychologic Depression?: Yes Anxiety?: Yes  Physical Exam: BP 112/72   Pulse 66   Ht 5\' 10"  (1.778 m)   Wt 227 lb (103 kg)   BMI 32.57 kg/m   Constitutional:  Alert and oriented, No acute distress. HEENT: [place] AT, moist mucus membranes.  Trachea midline, no masses. Cardiovascular: No clubbing, cyanosis, or edema. Respiratory: Normal respiratory effort, no increased work of breathing. GI: Abdomen is soft, nontender, nondistended, no abdominal masses GU: No CVA tenderness.  Skin: No rashes, bruises or suspicious lesions. Lymph: No cervical or inguinal adenopathy. Neurologic: Grossly intact, no focal deficits, moving all 4 extremities. Psychiatric: Normal mood and affect.  Laboratory Data: Lab Results  Component Value Date   WBC 9.8 02/24/2017   HGB 14.5 02/24/2017   HCT 42.3 02/24/2017   MCV 90.1 02/24/2017   PLT 191 02/24/2017    Lab Results  Component Value Date   CREATININE 0.73 02/24/2017    No results found for: PSA  No results  found for: TESTOSTERONE  No results found for: HGBA1C  Urinalysis    Component Value Date/Time   COLORURINE Yellow 10/07/2013 1333   APPEARANCEUR Clear 01/13/2017 1019   LABSPEC 1.012 10/07/2013 1333   PHURINE 7.0 10/07/2013 1333   GLUCOSEU Negative 01/13/2017 1019   GLUCOSEU Negative 10/07/2013 1333   HGBUR Negative 10/07/2013 1333   BILIRUBINUR Negative 01/13/2017 1019   BILIRUBINUR Negative 10/07/2013 1333   KETONESUR Negative 10/07/2013 1333   PROTEINUR Negative 01/13/2017 1019   PROTEINUR Negative 10/07/2013 1333   NITRITE Negative 01/13/2017 1019   NITRITE Negative 10/07/2013 1333   LEUKOCYTESUR Negative 01/13/2017 1019   LEUKOCYTESUR Negative 10/07/2013 1333   Procedure: The patient was taught the appropriate technique injected only at 3 and 9:00  on his penis.  He was again warned of the risk of priapism and need for emergent intervention.  He was told to avoid the dorsal and ventral sides of his penis for injection.  He was then injected with 0.5 cc of trimix.  The proper technique was demonstrated to him and his wife.  They voiced understanding of being able to do this himself as well.   Assessment & Plan:    1. Urinary frequency/urgency Continue Vesicare.   2. Erectile dysfunction The patient had a induction of erection with 0.5 cc of trimix.  He will continue this dose.  I did instruct him that if it is not sufficient for intercourse that he can increase by 0.1 cc per attempt.  He was instructed not to use the medication more than every other day and to rotate injection sites.  3. Prostate cancer screening Up-to-date. Repeat PSA and DRE in 1 year.  Return in about 1 year (around 06/23/2018) for PSA prior.  Nickie Retort, MD  [facility] [address] [place], [place] [address] [phone]

## 2017-06-26 DIAGNOSIS — F0280 Dementia in other diseases classified elsewhere without behavioral disturbance: Secondary | ICD-10-CM | POA: Diagnosis not present

## 2017-06-26 DIAGNOSIS — F331 Major depressive disorder, recurrent, moderate: Secondary | ICD-10-CM | POA: Diagnosis not present

## 2017-06-26 DIAGNOSIS — F411 Generalized anxiety disorder: Secondary | ICD-10-CM | POA: Diagnosis not present

## 2017-06-26 DIAGNOSIS — Z79899 Other long term (current) drug therapy: Secondary | ICD-10-CM | POA: Diagnosis not present

## 2017-06-27 DIAGNOSIS — M79672 Pain in left foot: Secondary | ICD-10-CM | POA: Diagnosis not present

## 2017-06-27 DIAGNOSIS — Q6621 Congenital metatarsus primus varus: Secondary | ICD-10-CM | POA: Diagnosis not present

## 2017-06-27 DIAGNOSIS — L600 Ingrowing nail: Secondary | ICD-10-CM | POA: Diagnosis not present

## 2017-06-27 DIAGNOSIS — M216X2 Other acquired deformities of left foot: Secondary | ICD-10-CM | POA: Diagnosis not present

## 2017-06-27 DIAGNOSIS — M79674 Pain in right toe(s): Secondary | ICD-10-CM | POA: Diagnosis not present

## 2017-06-27 DIAGNOSIS — M79675 Pain in left toe(s): Secondary | ICD-10-CM | POA: Diagnosis not present

## 2017-06-27 DIAGNOSIS — D2372 Other benign neoplasm of skin of left lower limb, including hip: Secondary | ICD-10-CM | POA: Diagnosis not present

## 2017-06-27 DIAGNOSIS — M79671 Pain in right foot: Secondary | ICD-10-CM | POA: Diagnosis not present

## 2017-06-27 DIAGNOSIS — B351 Tinea unguium: Secondary | ICD-10-CM | POA: Diagnosis not present

## 2017-06-27 DIAGNOSIS — M216X1 Other acquired deformities of right foot: Secondary | ICD-10-CM | POA: Diagnosis not present

## 2017-08-09 DIAGNOSIS — G20 Parkinson's disease: Secondary | ICD-10-CM | POA: Diagnosis not present

## 2017-08-09 DIAGNOSIS — G218 Other secondary parkinsonism: Secondary | ICD-10-CM | POA: Diagnosis not present

## 2017-08-11 DIAGNOSIS — M47816 Spondylosis without myelopathy or radiculopathy, lumbar region: Secondary | ICD-10-CM | POA: Diagnosis not present

## 2017-08-18 DIAGNOSIS — Z79899 Other long term (current) drug therapy: Secondary | ICD-10-CM | POA: Diagnosis not present

## 2017-08-18 DIAGNOSIS — F411 Generalized anxiety disorder: Secondary | ICD-10-CM | POA: Diagnosis not present

## 2017-08-18 DIAGNOSIS — G20 Parkinson's disease: Secondary | ICD-10-CM | POA: Diagnosis not present

## 2017-08-18 DIAGNOSIS — F3341 Major depressive disorder, recurrent, in partial remission: Secondary | ICD-10-CM | POA: Diagnosis not present

## 2017-08-18 DIAGNOSIS — E7800 Pure hypercholesterolemia, unspecified: Secondary | ICD-10-CM | POA: Diagnosis not present

## 2017-08-18 DIAGNOSIS — I10 Essential (primary) hypertension: Secondary | ICD-10-CM | POA: Diagnosis not present

## 2017-08-25 DIAGNOSIS — M47816 Spondylosis without myelopathy or radiculopathy, lumbar region: Secondary | ICD-10-CM | POA: Diagnosis not present

## 2017-09-08 DIAGNOSIS — M47816 Spondylosis without myelopathy or radiculopathy, lumbar region: Secondary | ICD-10-CM | POA: Diagnosis not present

## 2017-09-27 DIAGNOSIS — F411 Generalized anxiety disorder: Secondary | ICD-10-CM | POA: Diagnosis not present

## 2017-09-27 DIAGNOSIS — F331 Major depressive disorder, recurrent, moderate: Secondary | ICD-10-CM | POA: Diagnosis not present

## 2017-09-27 DIAGNOSIS — F0280 Dementia in other diseases classified elsewhere without behavioral disturbance: Secondary | ICD-10-CM | POA: Diagnosis not present

## 2017-10-18 DIAGNOSIS — M216X9 Other acquired deformities of unspecified foot: Secondary | ICD-10-CM | POA: Diagnosis not present

## 2017-10-18 DIAGNOSIS — Q6621 Congenital metatarsus primus varus: Secondary | ICD-10-CM | POA: Diagnosis not present

## 2017-10-18 DIAGNOSIS — M216X2 Other acquired deformities of left foot: Secondary | ICD-10-CM | POA: Diagnosis not present

## 2017-10-18 DIAGNOSIS — L600 Ingrowing nail: Secondary | ICD-10-CM | POA: Diagnosis not present

## 2017-10-19 DIAGNOSIS — G43109 Migraine with aura, not intractable, without status migrainosus: Secondary | ICD-10-CM | POA: Diagnosis not present

## 2017-10-24 DIAGNOSIS — F331 Major depressive disorder, recurrent, moderate: Secondary | ICD-10-CM | POA: Diagnosis not present

## 2017-10-24 DIAGNOSIS — F411 Generalized anxiety disorder: Secondary | ICD-10-CM | POA: Diagnosis not present

## 2017-10-24 DIAGNOSIS — Z79899 Other long term (current) drug therapy: Secondary | ICD-10-CM | POA: Diagnosis not present

## 2017-10-24 DIAGNOSIS — F0280 Dementia in other diseases classified elsewhere without behavioral disturbance: Secondary | ICD-10-CM | POA: Diagnosis not present

## 2017-11-17 DIAGNOSIS — F411 Generalized anxiety disorder: Secondary | ICD-10-CM | POA: Diagnosis not present

## 2017-11-17 DIAGNOSIS — F0280 Dementia in other diseases classified elsewhere without behavioral disturbance: Secondary | ICD-10-CM | POA: Diagnosis not present

## 2017-11-17 DIAGNOSIS — F331 Major depressive disorder, recurrent, moderate: Secondary | ICD-10-CM | POA: Diagnosis not present

## 2017-11-22 DIAGNOSIS — M47816 Spondylosis without myelopathy or radiculopathy, lumbar region: Secondary | ICD-10-CM | POA: Diagnosis not present

## 2017-11-28 DIAGNOSIS — E7800 Pure hypercholesterolemia, unspecified: Secondary | ICD-10-CM | POA: Diagnosis not present

## 2017-11-28 DIAGNOSIS — G20 Parkinson's disease: Secondary | ICD-10-CM | POA: Diagnosis not present

## 2017-11-28 DIAGNOSIS — Z0000 Encounter for general adult medical examination without abnormal findings: Secondary | ICD-10-CM | POA: Diagnosis not present

## 2017-11-28 DIAGNOSIS — R251 Tremor, unspecified: Secondary | ICD-10-CM | POA: Diagnosis not present

## 2017-11-28 DIAGNOSIS — I10 Essential (primary) hypertension: Secondary | ICD-10-CM | POA: Diagnosis not present

## 2017-11-28 DIAGNOSIS — Z79899 Other long term (current) drug therapy: Secondary | ICD-10-CM | POA: Diagnosis not present

## 2017-11-28 DIAGNOSIS — F321 Major depressive disorder, single episode, moderate: Secondary | ICD-10-CM | POA: Diagnosis not present

## 2018-01-05 ENCOUNTER — Other Ambulatory Visit: Payer: Self-pay

## 2018-01-12 ENCOUNTER — Ambulatory Visit: Payer: Self-pay

## 2018-01-16 DIAGNOSIS — R300 Dysuria: Secondary | ICD-10-CM | POA: Diagnosis not present

## 2018-01-18 DIAGNOSIS — Z209 Contact with and (suspected) exposure to unspecified communicable disease: Secondary | ICD-10-CM | POA: Diagnosis not present

## 2018-01-19 ENCOUNTER — Other Ambulatory Visit: Payer: Self-pay

## 2018-01-19 ENCOUNTER — Encounter: Payer: Self-pay | Admitting: Emergency Medicine

## 2018-01-19 ENCOUNTER — Emergency Department
Admission: EM | Admit: 2018-01-19 | Discharge: 2018-01-19 | Disposition: A | Discharge status: Home / Self Care | Payer: PPO | Attending: Emergency Medicine | Admitting: Emergency Medicine

## 2018-01-19 DIAGNOSIS — Z23 Encounter for immunization: Secondary | ICD-10-CM | POA: Insufficient documentation

## 2018-01-19 DIAGNOSIS — Z203 Contact with and (suspected) exposure to rabies: Secondary | ICD-10-CM | POA: Diagnosis not present

## 2018-01-19 DIAGNOSIS — Z2914 Encounter for prophylactic rabies immune globin: Secondary | ICD-10-CM | POA: Diagnosis not present

## 2018-01-19 MED ORDER — RABIES VACCINE, PCEC IM SUSR
1.0000 mL | Freq: Once | INTRAMUSCULAR | Status: AC
  Administered 2018-01-19: 1 mL via INTRAMUSCULAR
  Filled 2018-01-19: qty 1

## 2018-01-19 MED ORDER — RABIES IMMUNE GLOBULIN 150 UNIT/ML IM INJ
20.0000 [IU]/kg | INJECTION | Freq: Once | INTRAMUSCULAR | Status: AC
  Administered 2018-01-19: 2025 [IU] via INTRAMUSCULAR
  Filled 2018-01-19: qty 13.5
  Filled 2018-01-19: qty 14

## 2018-01-19 MED ORDER — TETANUS-DIPHTH-ACELL PERTUSSIS 5-2.5-18.5 LF-MCG/0.5 IM SUSP
0.5000 mL | Freq: Once | INTRAMUSCULAR | Status: AC
  Administered 2018-01-19: 0.5 mL via INTRAMUSCULAR
  Filled 2018-01-19: qty 0.5

## 2018-01-19 NOTE — ED Triage Notes (Signed)
Was advised to come for rabies shots due to bat in bedroom

## 2018-01-19 NOTE — ED Provider Notes (Signed)
[facility] Emergency Department Provider Note  ____________________________________________  Time seen: Approximately 4:23 PM  I have reviewed the triage vital signs and the nursing notes.   HISTORY  Chief Complaint Rabies Injection    HPI Christian Anderson. is a 67 y.o. male who presents the emergency department with his wife for rabies series.  Per the patient, there has been a bat in their house that both animal control, past control have been unable to remove.  They called the primary care who referred him into the emergency department for rabies series as it is unknown whether they have had direct contact or possibly been bitten.  he denies any skin manifestations consistent with any bite.   Patient denies any other symptoms or complaints at this time.  They are here for rabies series only.   Past Medical History:  Diagnosis Date  . Anxiety   . Depression   . Hyperlipemia   . Hypertension   . Tremor     Patient Active Problem List   Diagnosis Date Noted  . Encounter for examination for normal comparison or control in clinical research program 10/21/2015  . Amnesia 08/26/2015  . Concussion injury of body structure 12/23/2014  . Acute confusion due to medical condition 12/23/2014  . Depression, major, single episode, moderate ([place]) 12/23/2014  . H/O gastrointestinal disease 12/23/2014  . Anxiety, generalized 12/23/2014  . Hallucination, visual 12/23/2014  . H/O elevated lipids 12/23/2014  . H/O: HTN (hypertension) 12/23/2014  . Abnormal mental state 12/23/2014  . Insomnia, persistent 12/23/2014  . H/O: osteoarthritis 12/23/2014  . Restless leg syndrome 12/23/2014  . Parkinson disease, symptomatic ([place]) 12/23/2014  . General psychoses ([place]) 12/23/2014  . Apnea, sleep 12/23/2014  . H/O disease 12/23/2014  . Restless leg 12/23/2014  . Concussion w/o coma 12/23/2014  . Major depressive disorder, single episode, moderate ([place]) 12/23/2014  .  Cannot sleep 12/23/2014  . H/O cardiovascular disorder 12/23/2014  . Personal history of other specified conditions 12/23/2014  . Personal history of other diseases of the musculoskeletal system and connective tissue 12/23/2014  . 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 Hypercholesterolemia without hypertriglyceridemia 06/11/2014  . Pure hypercholesterolemia 06/11/2014  . Arthralgia of hip 02/06/2014  . H/O total hip arthroplasty 02/06/2014  . History of artificial joint 02/06/2014  . Adiposity 01/21/2014  . Idiopathic Parkinson's disease ([place]) 01/21/2014  . Parkinson's disease ([place]) 01/21/2014  . BP (high blood pressure) 12/23/2013  . Edema, peripheral 12/23/2013  . Accumulation of fluid in tissues 12/23/2013  . Essential (primary) hypertension 12/23/2013  . Edema 12/23/2013  . Confusion state 10/17/2013  . Disassociation disorder 10/17/2013  . Anxiety 09/13/2013  . Clinical depression 09/13/2013  . Disordered sleep 09/13/2013  . Major depressive disorder with single episode 09/13/2013  . Tremor 01/28/2013    Past Surgical History:  Procedure Laterality Date  . SQUAMOUS CELL CARCINOMA EXCISION    . TOTAL HIP ARTHROPLASTY     bilateral  . veins stripping      Prior to Admission medications   Medication Sig Start Date End Date Taking? Authorizing Provider  allopurinol (ZYLOPRIM) 100 MG tablet TK 1 T PO QD 02/11/15   [provider]  aspirin 325 MG tablet Take 325 mg by mouth daily.    [provider]  benztropine (COGENTIN) 0.5 MG tablet TK 1 T PO Q NIGHT 02/26/15   [provider]  carbidopa-levodopa (SINEMET IR) 25-250 MG tablet TK 1 T PO QID 09/30/15   [provider]  Cyanocobalamin (RA VITAMIN B-12 TR) 1000 MCG TBCR Take by mouth.    [provider]  donepezil (ARICEPT) 10 MG tablet Take 10 mg by mouth at bedtime.    [provider]  lisinopril (PRINIVIL,ZESTRIL) 20 MG tablet Take 20 mg by mouth daily.    [provider]  Melatonin 10 MG TABS Take by mouth.    [provider]  memantine [facility]) 5 MG tablet Reported on 10/22/2015 10/15/14   [provider]  Multiple Vitamin (MULTI-VITAMINS) TABS Take by mouth.    [provider]  nadolol (CORGARD) 80 MG tablet 1 1/2 bid 12/16/12   [provider]  Omega-3 Fatty Acids (FISH OIL PO) Take by mouth.    [provider]  pantoprazole (PROTONIX) 40 MG tablet TK 1 T PO QD 11/09/14   [provider]  senna (SENOKOT) 8.6 MG TABS tablet Take by mouth.    [provider]  sertraline (ZOLOFT) 100 MG tablet Take 1 tablet (100 mg total) by mouth daily. 06/25/15   Marjie Skiff, MD  solifenacin (VESICARE) 5 MG tablet Take 1 tablet (5 mg total) by mouth daily. 06/14/17   Nickie Retort, MD  traZODone (DESYREL) 100 MG tablet TK 1 TO 2 TS PO HS 05/09/16   [provider]    Allergies Patient has no known allergies.  Family History  Problem Relation Age of Onset  . Diabetes Father   . Heart attack Father   . Alzheimer's disease Mother   . Cancer - Ovarian Mother   . Leukemia Mother   . Breast cancer Sister   . Anxiety disorder Sister   . Depression Sister   . Headache Sister   . Prostate cancer Neg Hx   . Kidney cancer Neg Hx   . Bladder Cancer Neg Hx     Social History Social History   Tobacco Use  . Smoking status: Former Smoker    Packs/day: 0.50    Types: Cigarettes, Cigars    Last attempt to quit: 01/28/1978    Years since quitting: 40.0  . Smokeless tobacco: Never Used  . Tobacco comment: occasional cigar not had any in 3-4 years.  Substance Use Topics  . Alcohol use: Yes    Alcohol/week: 16.0 standard drinks    Types: 8 Glasses of wine, 8 Cans of beer per week    Comment: 2-3 beers nightly, occasional wine.  . Drug use: No     Review of Systems  Constitutional: No fever/chills Eyes: No visual changes. No discharge ENT: No upper respiratory  complaints. Cardiovascular: no chest pain. Respiratory: no cough. No SOB. Gastrointestinal: No abdominal pain.  No nausea, no vomiting.  No diarrhea.  No constipation. Genitourinary: Negative for dysuria. No hematuria Musculoskeletal: Negative for musculoskeletal pain. Skin: Negative for rash, abrasions, lacerations, ecchymosis. Neurological: Negative for headaches, focal weakness or numbness. 10-point ROS otherwise negative.  ____________________________________________   PHYSICAL EXAM:  VITAL SIGNS: ED Triage Vitals [01/19/18 1519]  Enc Vitals Group     BP (!) 141/69     Pulse Rate 62     Resp 18     Temp 97.9 F (36.6 C)     Temp Source Oral     SpO2 98 %     Weight 222 lb (100.7 kg)     Height 5\' 10"  (1.778 m)     Head Circumference      Peak Flow      Pain Score 0  Pain Loc      Pain Edu?      Excl. in [place]?      Constitutional: Alert and oriented. Well appearing and in no acute distress. Eyes: Conjunctivae are normal. PERRL. EOMI. Head: Atraumatic. ENT:      Ears:       Nose: No congestion/rhinnorhea.      Mouth/Throat: Mucous membranes are moist.  Neck: No stridor.  Neck is supple full range of motion Hematological/Lymphatic/Immunilogical: No cervical lymphadenopathy. Cardiovascular: Normal rate, regular rhythm. Normal S1 and S2.  Good peripheral circulation. Respiratory: Normal respiratory effort without tachypnea or retractions. Lungs CTAB. Good air entry to the bases with no decreased or absent breath sounds. Musculoskeletal: Full range of motion to all extremities. No gross deformities appreciated. Neurologic:  Normal speech and language. No gross focal neurologic deficits are appreciated.  Skin:  Skin is warm, dry and intact. No rash noted. Psychiatric: Mood and affect are normal. Speech and behavior are normal. Patient exhibits appropriate insight and judgement.   ____________________________________________   LABS (all labs ordered are listed,  but only abnormal results are displayed)  Labs Reviewed - No data to display ____________________________________________  EKG   ____________________________________________  RADIOLOGY   No results found.  ____________________________________________    PROCEDURES  Procedure(s) performed:    Procedures    Medications - No data to display   ____________________________________________   INITIAL IMPRESSION / ASSESSMENT AND PLAN / ED COURSE  Pertinent labs & imaging results that were available during my care of the patient were reviewed by me and considered in my medical decision making (see chart for details).  Review of the [place] CSRS was performed in accordance of the [place] prior to dispensing any controlled drugs.      Patient's diagnosis is consistent with encounter for rabies vaccination.  Patient and his wife presents the emergency department after possible contact with bat, possible repeat contacts as well.  They have been referred by primary care for rabies vaccination series.  Tdap, rabies vaccination, rabies immunoglobin administered today.  They will follow-up in 3 days for second rabies vaccine. Patient is given ED precautions to return to the ED for any worsening or new symptoms.     ____________________________________________  FINAL CLINICAL IMPRESSION(S) / ED DIAGNOSES  Final diagnoses:  None      NEW MEDICATIONS STARTED DURING THIS VISIT:  ED Discharge Orders    None          This chart was dictated using voice recognition software/Dragon. Despite best efforts to proofread, errors can occur which can change the meaning. Any change was purely unintentional.    Darletta Moll, PA-C 01/19/18 1710    Harvest Dark, MD 01/19/18 2314

## 2018-01-19 NOTE — ED Triage Notes (Signed)
Sent in for rabies series   States they found a bat in the bedroom

## 2018-01-22 ENCOUNTER — Encounter: Payer: Self-pay | Admitting: Emergency Medicine

## 2018-01-22 ENCOUNTER — Emergency Department
Admission: EM | Admit: 2018-01-22 | Discharge: 2018-01-22 | Disposition: A | Discharge status: Home / Self Care | Payer: PPO | Attending: Emergency Medicine | Admitting: Emergency Medicine

## 2018-01-22 ENCOUNTER — Other Ambulatory Visit: Payer: Self-pay

## 2018-01-22 DIAGNOSIS — E785 Hyperlipidemia, unspecified: Secondary | ICD-10-CM | POA: Diagnosis not present

## 2018-01-22 DIAGNOSIS — Z203 Contact with and (suspected) exposure to rabies: Secondary | ICD-10-CM | POA: Diagnosis not present

## 2018-01-22 DIAGNOSIS — Z2914 Encounter for prophylactic rabies immune globin: Secondary | ICD-10-CM | POA: Diagnosis not present

## 2018-01-22 DIAGNOSIS — I10 Essential (primary) hypertension: Secondary | ICD-10-CM | POA: Insufficient documentation

## 2018-01-22 DIAGNOSIS — Z79899 Other long term (current) drug therapy: Secondary | ICD-10-CM | POA: Insufficient documentation

## 2018-01-22 DIAGNOSIS — G20 Parkinson's disease: Secondary | ICD-10-CM | POA: Insufficient documentation

## 2018-01-22 DIAGNOSIS — Z87891 Personal history of nicotine dependence: Secondary | ICD-10-CM | POA: Insufficient documentation

## 2018-01-22 MED ORDER — RABIES VACCINE, PCEC IM SUSR
1.0000 mL | Freq: Once | INTRAMUSCULAR | Status: AC
  Administered 2018-01-22: 1 mL via INTRAMUSCULAR
  Filled 2018-01-22: qty 1

## 2018-01-22 NOTE — ED Provider Notes (Signed)
[facility] Emergency Department Provider Note  ____________________________________________   First MD Initiated Contact with Patient 01/22/18 [phone]     (approximate)  I have reviewed the triage vital signs and the nursing notes.   HISTORY  Chief Complaint Rabies Injection   HPI Ko Bardon. is a 67 y.o. male presents to the emergency department for second rabies injection.  Patient was exposed to a bat and has already gotten his first series of rabies prophylaxis.  He denies any difficulties with the injections.   Past Medical History:  Diagnosis Date  . Anxiety   . Depression   . Hyperlipemia   . Hypertension   . Tremor     Patient Active Problem List   Diagnosis Date Noted  . Encounter for examination for normal comparison or control in clinical research program 10/21/2015  . Amnesia 08/26/2015  . Concussion injury of body structure 12/23/2014  . Acute confusion due to medical condition 12/23/2014  . Depression, major, single episode, moderate ([place]) 12/23/2014  . H/O gastrointestinal disease 12/23/2014  . Anxiety, generalized 12/23/2014  . Hallucination, visual 12/23/2014  . H/O elevated lipids 12/23/2014  . H/O: HTN (hypertension) 12/23/2014  . Abnormal mental state 12/23/2014  . Insomnia, persistent 12/23/2014  . H/O: osteoarthritis 12/23/2014  . Restless leg syndrome 12/23/2014  . Parkinson disease, symptomatic ([place]) 12/23/2014  . General psychoses ([place]) 12/23/2014  . Apnea, sleep 12/23/2014  . H/O disease 12/23/2014  . Restless leg 12/23/2014  . Concussion w/o coma 12/23/2014  . Major depressive disorder, single episode, moderate ([place]) 12/23/2014  . Cannot sleep 12/23/2014  . H/O cardiovascular disorder 12/23/2014  . Personal history of other specified conditions 12/23/2014  . Personal history of other diseases of the musculoskeletal system and connective tissue 12/23/2014  . 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  Hypercholesterolemia without hypertriglyceridemia 06/11/2014  . Pure hypercholesterolemia 06/11/2014  . Arthralgia of hip 02/06/2014  . H/O total hip arthroplasty 02/06/2014  . History of artificial joint 02/06/2014  . Adiposity 01/21/2014  . Idiopathic Parkinson's disease ([place]) 01/21/2014  . Parkinson's disease ([place]) 01/21/2014  . BP (high blood pressure) 12/23/2013  . Edema, peripheral 12/23/2013  . Accumulation of fluid in tissues 12/23/2013  . Essential (primary) hypertension 12/23/2013  . Edema 12/23/2013  . Confusion state 10/17/2013  . Disassociation disorder 10/17/2013  . Anxiety 09/13/2013  . Clinical depression 09/13/2013  . Disordered sleep 09/13/2013  . Major depressive disorder with single episode 09/13/2013  . Tremor 01/28/2013    Past Surgical History:  Procedure Laterality Date  . SQUAMOUS CELL CARCINOMA EXCISION    . TOTAL HIP ARTHROPLASTY     bilateral  . veins stripping      Prior to Admission medications   Medication Sig Start Date End Date Taking? Authorizing Provider  allopurinol (ZYLOPRIM) 100 MG tablet TK 1 T PO QD 02/11/15   [provider]  aspirin 325 MG tablet Take 325 mg by mouth daily.    [provider]  benztropine (COGENTIN) 0.5 MG tablet TK 1 T PO Q NIGHT 02/26/15   [provider]  carbidopa-levodopa (SINEMET IR) 25-250 MG tablet TK 1 T PO QID 09/30/15   [provider]  Cyanocobalamin (RA VITAMIN B-12 TR) 1000 MCG TBCR Take by mouth.    [provider]  donepezil (ARICEPT) 10 MG tablet Take 10 mg by mouth at bedtime.    [provider]  lisinopril (PRINIVIL,ZESTRIL) 20 MG tablet Take 20 mg by mouth daily.  [provider]  Melatonin 10 MG TABS Take by mouth.    [provider]  memantine [facility]) 5 MG tablet Reported on 10/22/2015 10/15/14   [provider]  Multiple Vitamin (MULTI-VITAMINS) TABS Take by mouth.    [provider]  nadolol (CORGARD) 80 MG  tablet 1 1/2 bid 12/16/12   [provider]  Omega-3 Fatty Acids (FISH OIL PO) Take by mouth.    [provider]  pantoprazole (PROTONIX) 40 MG tablet TK 1 T PO QD 11/09/14   [provider]  senna (SENOKOT) 8.6 MG TABS tablet Take by mouth.    [provider]  sertraline (ZOLOFT) 100 MG tablet Take 1 tablet (100 mg total) by mouth daily. 06/25/15   Marjie Skiff, MD  solifenacin (VESICARE) 5 MG tablet Take 1 tablet (5 mg total) by mouth daily. 06/14/17   Nickie Retort, MD  traZODone (DESYREL) 100 MG tablet TK 1 TO 2 TS PO HS 05/09/16   [provider]    Allergies Patient has no known allergies.  Family History  Problem Relation Age of Onset  . Diabetes Father   . Heart attack Father   . Alzheimer's disease Mother   . Cancer - Ovarian Mother   . Leukemia Mother   . Breast cancer Sister   . Anxiety disorder Sister   . Depression Sister   . Headache Sister   . Prostate cancer Neg Hx   . Kidney cancer Neg Hx   . Bladder Cancer Neg Hx     Social History Social History   Tobacco Use  . Smoking status: Former Smoker    Packs/day: 0.50    Types: Cigarettes, Cigars    Last attempt to quit: 01/28/1978    Years since quitting: 40.0  . Smokeless tobacco: Never Used  . Tobacco comment: occasional cigar not had any in 3-4 years.  Substance Use Topics  . Alcohol use: Yes    Alcohol/week: 16.0 standard drinks    Types: 8 Glasses of wine, 8 Cans of beer per week    Comment: 2-3 beers nightly, occasional wine.  . Drug use: No    Review of Systems Constitutional: No fever/chills Skin: Negative for rash. Neurological: Negative for  focal weakness or numbness. ____________________________________________   PHYSICAL EXAM:  VITAL SIGNS: ED Triage Vitals  Enc Vitals Group     BP 01/22/18 0830 135/63     Pulse Rate 01/22/18 0830 (!) 59     Resp 01/22/18 0830 16     Temp 01/22/18 0830 97.7 F (36.5 C)     Temp Source 01/22/18 0830  Oral     SpO2 01/22/18 0830 100 %     Weight 01/22/18 0825 222 lb 0.1 oz (100.7 kg)     Height 01/22/18 0832 5\' 10"  (1.778 m)     Head Circumference --      Peak Flow --      Pain Score 01/22/18 0825 0     Pain Loc --      Pain Edu? --      Excl. in [place]? --    Constitutional: Alert and oriented. Well appearing and in no acute distress. Eyes: Conjunctivae are normal.  Head: Atraumatic. Neck: No stridor.    Respiratory: Normal respiratory effort.  No retractions.  Musculoskeletal: Moves all extremities with any difficulty.  Patient is ambulatory. Neurologic:  Normal speech and language. No gross focal neurologic deficits are appreciated.  Skin:  Skin is warm, dry  and intact.  Psychiatric: Mood and affect are normal. Speech and behavior are normal.  ____________________________________________   LABS (all labs ordered are listed, but only abnormal results are displayed)  Labs Reviewed - No data to display   PROCEDURES  Procedure(s) performed: None  Procedures  Critical Care performed: No  ____________________________________________   INITIAL IMPRESSION / ASSESSMENT AND PLAN / ED COURSE  As part of my medical decision making, I reviewed the following data within the electronic MEDICAL RECORD NUMBER Notes from prior ED visits and [place] Controlled Substance Database  Patient was given second rabies prophylactic injection.  He is to return for remaining injections. ____________________________________________   FINAL CLINICAL IMPRESSION(S) / ED DIAGNOSES  Final diagnoses:  Need for post exposure prophylaxis for rabies     ED Discharge Orders    None       Note:  This document was prepared using Dragon voice recognition software and may include unintentional dictation errors.    Johnn Hai, PA-C 01/22/18 1133    Lavonia Drafts, MD 01/22/18 1218

## 2018-01-22 NOTE — ED Triage Notes (Signed)
ARrives for 2nd Rabies injection

## 2018-01-22 NOTE — Discharge Instructions (Signed)
Return for remaining rabies injections. °

## 2018-01-26 ENCOUNTER — Emergency Department
Admission: EM | Admit: 2018-01-26 | Discharge: 2018-01-26 | Disposition: A | Discharge status: Home / Self Care | Payer: PPO | Attending: Emergency Medicine | Admitting: Emergency Medicine

## 2018-01-26 ENCOUNTER — Encounter: Payer: Self-pay | Admitting: Emergency Medicine

## 2018-01-26 DIAGNOSIS — I10 Essential (primary) hypertension: Secondary | ICD-10-CM | POA: Diagnosis not present

## 2018-01-26 DIAGNOSIS — Z87891 Personal history of nicotine dependence: Secondary | ICD-10-CM | POA: Insufficient documentation

## 2018-01-26 DIAGNOSIS — Z96643 Presence of artificial hip joint, bilateral: Secondary | ICD-10-CM | POA: Insufficient documentation

## 2018-01-26 DIAGNOSIS — Z23 Encounter for immunization: Secondary | ICD-10-CM | POA: Diagnosis not present

## 2018-01-26 DIAGNOSIS — G20 Parkinson's disease: Secondary | ICD-10-CM | POA: Diagnosis not present

## 2018-01-26 MED ORDER — RABIES VACCINE, PCEC IM SUSR
1.0000 mL | Freq: Once | INTRAMUSCULAR | Status: AC
  Administered 2018-01-26: 1 mL via INTRAMUSCULAR
  Filled 2018-01-26: qty 1

## 2018-01-26 NOTE — ED Triage Notes (Signed)
Pt here for 3rd rabies injection 

## 2018-01-26 NOTE — ED Provider Notes (Signed)
[facility] Emergency Department Provider Note       Time seen: ----------------------------------------- 9:37 AM on 01/26/2018 -----------------------------------------   I have reviewed the triage vital signs and the nursing notes.  HISTORY   Chief Complaint Rabies Injection    HPI Christian Anderson. is a 67 y.o. male with a history of anxiety, depression, hypertension who presents to the ED for the third rabies injection.  They have had no medical complaints.  The bat in question was found to be non-rapid.  Past Medical History:  Diagnosis Date  . Anxiety   . Depression   . Hyperlipemia   . Hypertension   . Tremor     Patient Active Problem List   Diagnosis Date Noted  . Encounter for examination for normal comparison or control in clinical research program 10/21/2015  . Amnesia 08/26/2015  . Concussion injury of body structure 12/23/2014  . Acute confusion due to medical condition 12/23/2014  . Depression, major, single episode, moderate ([place]) 12/23/2014  . H/O gastrointestinal disease 12/23/2014  . Anxiety, generalized 12/23/2014  . Hallucination, visual 12/23/2014  . H/O elevated lipids 12/23/2014  . H/O: HTN (hypertension) 12/23/2014  . Abnormal mental state 12/23/2014  . Insomnia, persistent 12/23/2014  . H/O: osteoarthritis 12/23/2014  . Restless leg syndrome 12/23/2014  . Parkinson disease, symptomatic ([place]) 12/23/2014  . General psychoses ([place]) 12/23/2014  . Apnea, sleep 12/23/2014  . H/O disease 12/23/2014  . Restless leg 12/23/2014  . Concussion w/o coma 12/23/2014  . Major depressive disorder, single episode, moderate ([place]) 12/23/2014  . Cannot sleep 12/23/2014  . H/O cardiovascular disorder 12/23/2014  . Personal history of other specified conditions 12/23/2014  . Personal history of other diseases of the musculoskeletal system and connective tissue 12/23/2014  . 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  Hypercholesterolemia without hypertriglyceridemia 06/11/2014  . Pure hypercholesterolemia 06/11/2014  . Arthralgia of hip 02/06/2014  . H/O total hip arthroplasty 02/06/2014  . History of artificial joint 02/06/2014  . Adiposity 01/21/2014  . Idiopathic Parkinson's disease ([place]) 01/21/2014  . Parkinson's disease ([place]) 01/21/2014  . BP (high blood pressure) 12/23/2013  . Edema, peripheral 12/23/2013  . Accumulation of fluid in tissues 12/23/2013  . Essential (primary) hypertension 12/23/2013  . Edema 12/23/2013  . Confusion state 10/17/2013  . Disassociation disorder 10/17/2013  . Anxiety 09/13/2013  . Clinical depression 09/13/2013  . Disordered sleep 09/13/2013  . Major depressive disorder with single episode 09/13/2013  . Tremor 01/28/2013    Past Surgical History:  Procedure Laterality Date  . SQUAMOUS CELL CARCINOMA EXCISION    . TOTAL HIP ARTHROPLASTY     bilateral  . veins stripping      Allergies Patient has no known allergies.  Social History Social History   Tobacco Use  . Smoking status: Former Smoker    Packs/day: 0.50    Types: Cigarettes, Cigars    Last attempt to quit: 01/28/1978    Years since quitting: 40.0  . Smokeless tobacco: Never Used  . Tobacco comment: occasional cigar not had any in 3-4 years.  Substance Use Topics  . Alcohol use: Yes    Alcohol/week: 16.0 standard drinks    Types: 8 Glasses of wine, 8 Cans of beer per week    Comment: 2-3 beers nightly, occasional wine.  . Drug use: No   Review of Systems Constitutional: Negative for fever. Cardiovascular: Negative for chest pain. Respiratory: Negative for shortness of breath.  All systems negative/normal/unremarkable except as stated in the  HPI  ____________________________________________   PHYSICAL EXAM:  VITAL SIGNS: ED Triage Vitals  Enc Vitals Group     BP 01/26/18 0914 138/64     Pulse Rate 01/26/18 0914 60     Resp 01/26/18 0914 20     Temp 01/26/18 0914 98.2 F  (36.8 C)     Temp Source 01/26/18 0914 Oral     SpO2 01/26/18 0914 99 %     Weight 01/26/18 0915 225 lb (102.1 kg)     Height 01/26/18 0915 5\' 10"  (1.778 m)     Head Circumference --      Peak Flow --      Pain Score 01/26/18 0915 0     Pain Loc --      Pain Edu? --      Excl. in [place]? --    Constitutional: Alert and oriented. Well appearing and in no distress.  Cardiovascular: Normal rate, regular rhythm. No murmurs, rubs, or gallops. Respiratory: Normal respiratory effort without tachypnea nor retractions. Breath sounds are clear and equal bilaterally. Musculoskeletal:  normal range of motion in extremities.  Neurologic:  Normal speech and language.  Psychiatric: Mood and affect are normal.  ____________________________________________  ED COURSE:  As part of my medical decision making, I reviewed the following data within the [place] History obtained from family if available, nursing notes, old chart and ekg, as well as notes from prior ED visits. Patient presented for the third rabies injection, we will assess with labs and imaging as indicated at this time.   Procedures ___________________________________________  DIFFERENTIAL DIAGNOSIS   Rabies vaccination  FINAL ASSESSMENT AND PLAN  Rabies vaccination   Plan: The patient had presented for third rabies injection.  They are asymptomatic and are cleared for outpatient follow-up as needed   Laurence Aly, MD   Note: This note was generated in part or whole with voice recognition software. Voice recognition is usually quite accurate but there are transcription errors that can and very often do occur. I apologize for any typographical errors that were not detected and corrected.     Earleen Newport, MD 01/26/18 [id number]

## 2018-02-21 DIAGNOSIS — Z79899 Other long term (current) drug therapy: Secondary | ICD-10-CM | POA: Diagnosis not present

## 2018-02-21 DIAGNOSIS — I10 Essential (primary) hypertension: Secondary | ICD-10-CM | POA: Diagnosis not present

## 2018-02-21 DIAGNOSIS — M216X1 Other acquired deformities of right foot: Secondary | ICD-10-CM | POA: Diagnosis not present

## 2018-02-21 DIAGNOSIS — M79672 Pain in left foot: Secondary | ICD-10-CM | POA: Diagnosis not present

## 2018-02-21 DIAGNOSIS — E7800 Pure hypercholesterolemia, unspecified: Secondary | ICD-10-CM | POA: Diagnosis not present

## 2018-02-21 DIAGNOSIS — M216X9 Other acquired deformities of unspecified foot: Secondary | ICD-10-CM | POA: Diagnosis not present

## 2018-02-21 DIAGNOSIS — L851 Acquired keratosis [keratoderma] palmaris et plantaris: Secondary | ICD-10-CM | POA: Diagnosis not present

## 2018-02-21 DIAGNOSIS — M79671 Pain in right foot: Secondary | ICD-10-CM | POA: Diagnosis not present

## 2018-02-21 DIAGNOSIS — M216X2 Other acquired deformities of left foot: Secondary | ICD-10-CM | POA: Diagnosis not present

## 2018-02-21 DIAGNOSIS — L600 Ingrowing nail: Secondary | ICD-10-CM | POA: Diagnosis not present

## 2018-02-21 DIAGNOSIS — Q6621 Congenital metatarsus primus varus: Secondary | ICD-10-CM | POA: Diagnosis not present

## 2018-02-28 DIAGNOSIS — Z1211 Encounter for screening for malignant neoplasm of colon: Secondary | ICD-10-CM | POA: Diagnosis not present

## 2018-02-28 DIAGNOSIS — F411 Generalized anxiety disorder: Secondary | ICD-10-CM | POA: Diagnosis not present

## 2018-02-28 DIAGNOSIS — R251 Tremor, unspecified: Secondary | ICD-10-CM | POA: Diagnosis not present

## 2018-02-28 DIAGNOSIS — I10 Essential (primary) hypertension: Secondary | ICD-10-CM | POA: Diagnosis not present

## 2018-02-28 DIAGNOSIS — G20 Parkinson's disease: Secondary | ICD-10-CM | POA: Diagnosis not present

## 2018-02-28 DIAGNOSIS — F321 Major depressive disorder, single episode, moderate: Secondary | ICD-10-CM | POA: Diagnosis not present

## 2018-03-05 DIAGNOSIS — R441 Visual hallucinations: Secondary | ICD-10-CM | POA: Diagnosis not present

## 2018-03-05 DIAGNOSIS — G20 Parkinson's disease: Secondary | ICD-10-CM | POA: Diagnosis not present

## 2018-03-16 DIAGNOSIS — Z23 Encounter for immunization: Secondary | ICD-10-CM | POA: Diagnosis not present

## 2018-04-04 DIAGNOSIS — G4733 Obstructive sleep apnea (adult) (pediatric): Secondary | ICD-10-CM | POA: Diagnosis not present

## 2018-04-04 DIAGNOSIS — K219 Gastro-esophageal reflux disease without esophagitis: Secondary | ICD-10-CM | POA: Diagnosis not present

## 2018-04-04 DIAGNOSIS — Z86010 Personal history of colonic polyps: Secondary | ICD-10-CM | POA: Diagnosis not present

## 2018-04-04 DIAGNOSIS — G20 Parkinson's disease: Secondary | ICD-10-CM | POA: Diagnosis not present

## 2018-04-04 DIAGNOSIS — R1314 Dysphagia, pharyngoesophageal phase: Secondary | ICD-10-CM | POA: Diagnosis not present

## 2018-04-11 DIAGNOSIS — M5136 Other intervertebral disc degeneration, lumbar region: Secondary | ICD-10-CM | POA: Diagnosis not present

## 2018-04-11 DIAGNOSIS — M5416 Radiculopathy, lumbar region: Secondary | ICD-10-CM | POA: Diagnosis not present

## 2018-04-13 ENCOUNTER — Other Ambulatory Visit: Payer: PPO

## 2018-04-13 DIAGNOSIS — N529 Male erectile dysfunction, unspecified: Secondary | ICD-10-CM

## 2018-04-14 LAB — PSA: PROSTATE SPECIFIC AG, SERUM: 4.8 ng/mL — AB (ref 0.0–4.0)

## 2018-04-17 ENCOUNTER — Encounter: Payer: Self-pay | Admitting: Urology

## 2018-04-17 ENCOUNTER — Ambulatory Visit: Payer: PPO | Admitting: Urology

## 2018-04-17 VITALS — BP 128/72 | HR 57 | Ht 70.0 in | Wt 224.0 lb

## 2018-04-17 DIAGNOSIS — R350 Frequency of micturition: Secondary | ICD-10-CM

## 2018-04-17 DIAGNOSIS — N3281 Overactive bladder: Secondary | ICD-10-CM | POA: Diagnosis not present

## 2018-04-17 DIAGNOSIS — M549 Dorsalgia, unspecified: Secondary | ICD-10-CM

## 2018-04-17 DIAGNOSIS — G8929 Other chronic pain: Secondary | ICD-10-CM

## 2018-04-17 MED ORDER — SOLIFENACIN SUCCINATE 10 MG PO TABS: 10.0000 mg | ORAL_TABLET | Freq: Every day | ORAL | 11 refills | Status: AC

## 2018-04-17 NOTE — Progress Notes (Signed)
04/17/2018 3:52 PM   Christian Anderson. 29-Oct-1950 [id number]  Referring provider: Idelle Crouch, MD [place] [facility] [place], [place] [address]  Chief Complaint  Patient presents with  . Follow-up    HPI: 67 year old male presents for annual follow-up.  He last saw Dr. Pilar Jarvis in January 2019.  He has a history of an elevated PSA up to 6.07.  His baseline has been in the 3 range.  He declined further evaluation with biopsy and elected to monitor.  PSA in August 2018 was 3.0.  His most recent PSA on 04/13/2018 had an increased to 4.8.  He also has lower urinary tract symptoms primarily urinary frequency and urgency however since his last visit he has had increased nocturia.  He was on Myrbetriq however his insurance would not cover.  He is currently on Vesicare 5 mg daily.  He does have Parkinson's.  He denies dysuria or gross hematuria.  Denies flank, abdominal or pelvic pain.  He also saw Dr. Pilar Jarvis for ED and was to start intracavernosal injection training however decided not to pursue.  PMH: Past Medical History:  Diagnosis Date  . Anxiety   . Depression   . Hyperlipemia   . Hypertension   . Tremor     Surgical History: Past Surgical History:  Procedure Laterality Date  . SQUAMOUS CELL CARCINOMA EXCISION    . TOTAL HIP ARTHROPLASTY     bilateral  . veins stripping      Home Medications:  Allergies as of 04/17/2018   No Known Allergies     Medication List        Accurate as of 04/17/18  3:52 PM. Always use your most recent med list.          allopurinol 100 MG tablet Commonly known as:  ZYLOPRIM TK 1 T PO QD   aspirin 325 MG tablet Take 325 mg by mouth daily.   benztropine 0.5 MG tablet Commonly known as:  COGENTIN TK 1 T PO Q NIGHT   buPROPion 150 MG 12 hr tablet Commonly known as:  WELLBUTRIN SR Take by mouth.   carbidopa-levodopa 25-250 MG tablet Commonly known as:  SINEMET IR TK 1 T PO QID   donepezil 10 MG  tablet Commonly known as:  ARICEPT Take 10 mg by mouth at bedtime.   FISH OIL PO Take by mouth.   lisinopril 20 MG tablet Commonly known as:  PRINIVIL,ZESTRIL Take 20 mg by mouth daily.   Melatonin 10 MG Tabs Take by mouth.   memantine 5 MG tablet Commonly known as:  NAMENDA Reported on 10/22/2015   MULTI-VITAMINS Tabs Take by mouth.   nadolol 80 MG tablet Commonly known as:  CORGARD 1 1/2 bid   orphenadrine 100 MG tablet Commonly known as:  NORFLEX 1/2-1 po q HS prn   pantoprazole 40 MG tablet Commonly known as:  PROTONIX TK 1 T PO QD   RA VITAMIN B-12 TR 1000 MCG Tbcr Generic drug:  Cyanocobalamin Take by mouth.   senna 8.6 MG Tabs tablet Commonly known as:  SENOKOT Take by mouth.   sertraline 100 MG tablet Commonly known as:  ZOLOFT Take 1 tablet (100 mg total) by mouth daily.   solifenacin 5 MG tablet Commonly known as:  VESICARE Take 1 tablet (5 mg total) by mouth daily.   traMADol 50 MG tablet Commonly known as:  ULTRAM   traZODone 100 MG tablet Commonly known as:  DESYREL TK 1 TO 2  TS PO HS       Allergies: No Known Allergies  Family History: Family History  Problem Relation Age of Onset  . Diabetes Father   . Heart attack Father   . Alzheimer's disease Mother   . Cancer - Ovarian Mother   . Leukemia Mother   . Breast cancer Sister   . Anxiety disorder Sister   . Depression Sister   . Headache Sister   . Prostate cancer Neg Hx   . Kidney cancer Neg Hx   . Bladder Cancer Neg Hx     Social History:  reports that he quit smoking about 40 years ago. His smoking use included cigarettes and cigars. He smoked 0.50 packs per day. He has never used smokeless tobacco. He reports that he drinks about 16.0 standard drinks of alcohol per week. He reports that he does not use drugs.  ROS: UROLOGY Frequent Urination?: Yes Hard to postpone urination?: Yes Burning/pain with urination?: Yes Get up at night to urinate?: Yes Leakage of urine?:  Yes Urine stream starts and stops?: Yes Trouble starting stream?: No Do you have to strain to urinate?: No Blood in urine?: No Urinary tract infection?: No Sexually transmitted disease?: No Injury to kidneys or bladder?: No Painful intercourse?: No Weak stream?: No Erection problems?: Yes Penile pain?: No  Gastrointestinal Nausea?: No Vomiting?: No Indigestion/heartburn?: No Diarrhea?: No Constipation?: No  Constitutional Fever: No Night sweats?: No Weight loss?: No Fatigue?: No  Skin Skin rash/lesions?: No Itching?: No  Eyes Blurred vision?: Yes Double vision?: No  Ears/Nose/Throat Sore throat?: No Sinus problems?: Yes  Hematologic/Lymphatic Swollen glands?: No Easy bruising?: No  Cardiovascular Leg swelling?: No Chest pain?: No  Respiratory Cough?: No Shortness of breath?: Yes  Endocrine Excessive thirst?: No  Musculoskeletal Back pain?: Yes Joint pain?: No  Neurological Headaches?: No Dizziness?: Yes  Psychologic Depression?: Yes Anxiety?: Yes  Physical Exam: BP 128/72 (BP Location: Right Arm, Patient Position: Sitting)   Pulse (!) 57   Ht 5\' 10"  (1.778 m)   Wt 224 lb (101.6 kg)   BMI 32.14 kg/m   Constitutional:  Alert and oriented, No acute distress. HEENT: [place] AT, moist mucus membranes.  Trachea midline, no masses. Cardiovascular: No clubbing, cyanosis, or edema. Respiratory: Normal respiratory effort, no increased work of breathing. GI: Abdomen is soft, nontender, nondistended, no abdominal masses GU: No CVA tenderness.  Prostate 35 g, smooth without nodules. Lymph: No cervical or inguinal lymphadenopathy. Skin: No rashes, bruises or suspicious lesions. Neurologic: Grossly intact, no focal deficits, moving all 4 extremities. Psychiatric: Normal mood and affect.   Assessment & Plan:   67 year old male with lower urinary tract symptoms.  He has Parkinson's and most likely has an element of neurogenic detrusor overactivity as  well as BPH.  We will initially increase his solifenacin to 10 mg.  If his symptoms do not improve will add Myrbetriq 25 mg.  If he starts the Myrbetriq he will call back and we will send in Rx.  He was given samples to start.  Will schedule a screening renal ultrasound and bladder views for PVR.  Would recommend repeating his PSA and 6 months.  He is scheduled to move to [facility] next month and indicated he will establish local urologic care there.   Abbie Sons, [place] [address], [place], [place] [address] [phone]

## 2018-04-18 LAB — URINALYSIS, COMPLETE
BILIRUBIN UA: NEGATIVE
Glucose, UA: NEGATIVE
Leukocytes, UA: NEGATIVE
Nitrite, UA: NEGATIVE
PH UA: 5 (ref 5.0–7.5)
PROTEIN UA: NEGATIVE
RBC, UA: NEGATIVE
SPEC GRAV UA: 1.025 (ref 1.005–1.030)
Urobilinogen, Ur: 0.2 mg/dL (ref 0.2–1.0)

## 2018-04-18 IMAGING — MR MR LUMBAR SPINE W/O CM
4 of 5 series · 23 of 48 positions shown · non-contrast
Comparison: Fluoroscopic guided lumbar puncture images 3963.

CLINICAL DATA: 65-year-old male with lumbar back pain radiating to
the bilateral buttocks and lower extremities as far as the knees.
Symptoms since [REDACTED] 1510. Initial encounter.

EXAM:
MRI LUMBAR SPINE WITHOUT CONTRAST
TECHNIQUE: Multiplanar, multisequence MR imaging of the lumbar spine was
performed. No intravenous contrast was administered.

[Series 2: T2 · sagittal · 4.0mm · 0.81mm/px · 6 of 15 slices shown (1 of 2)]
[im 1/15]
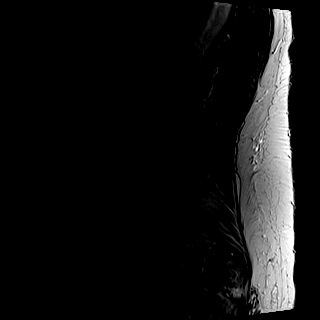
[im 3/15]
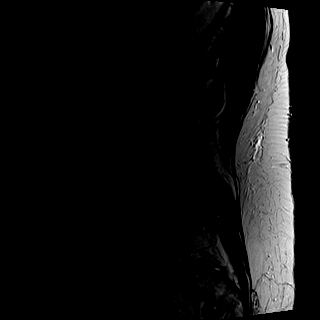
[im 6/15]
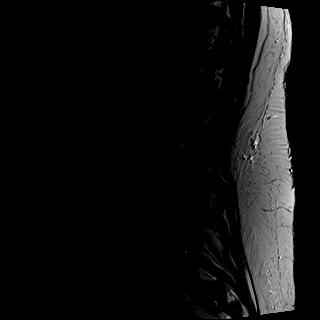
[im 9/15]
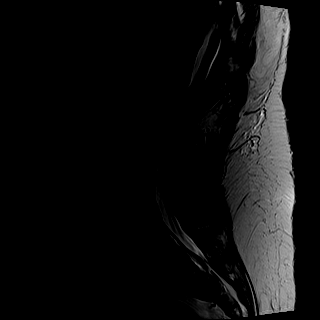
[im 12/15]
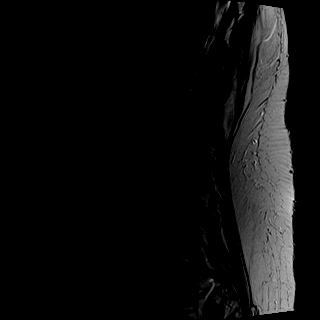
[im 15/15]
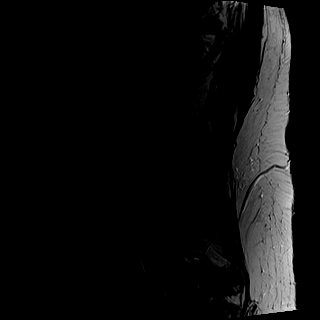

[Series 3: T1 · sagittal · 4.0mm · 0.41mm/px · 5 of 15 slices shown (1 of 2)]
[im 1/15]
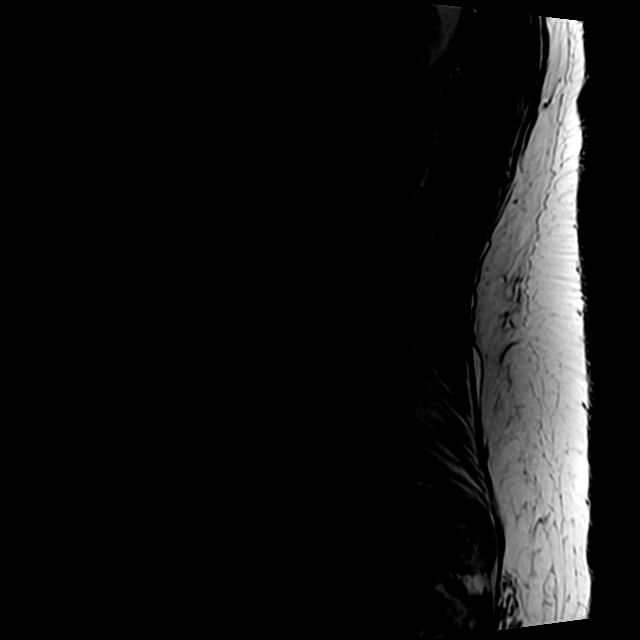
[im 3/15]
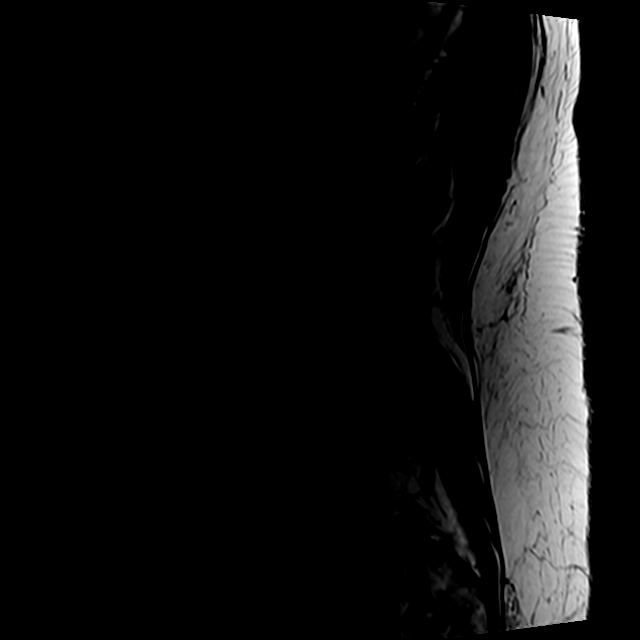
[im 6/15]
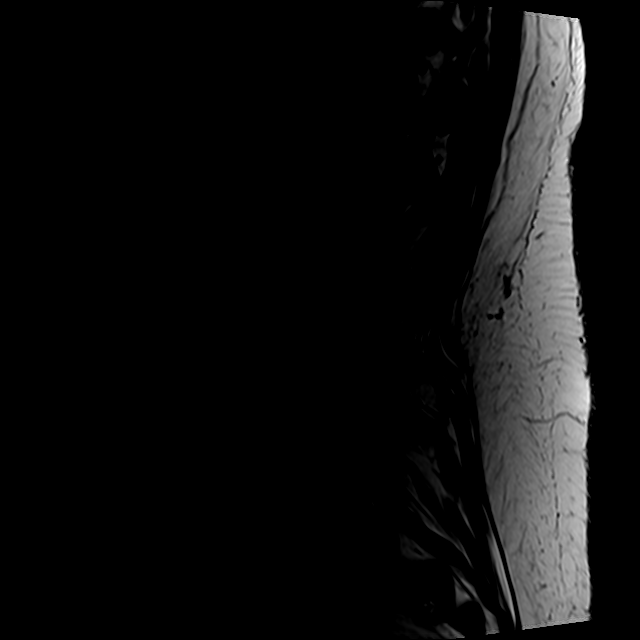
[im 9/15]
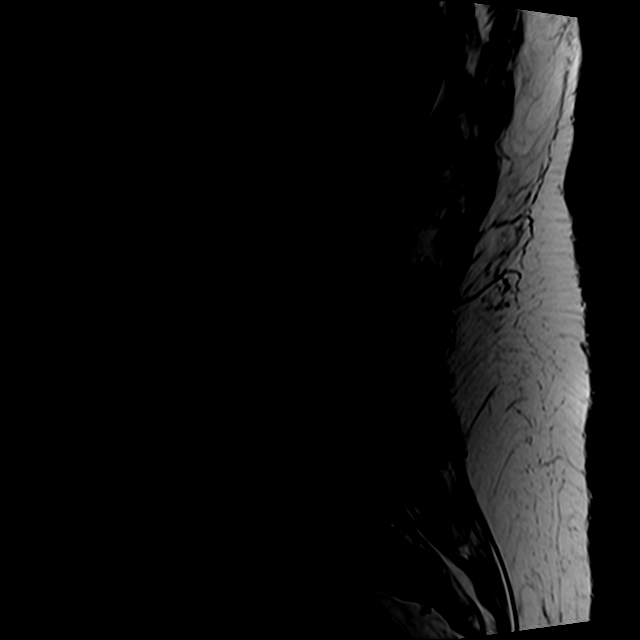
[im 15/15]
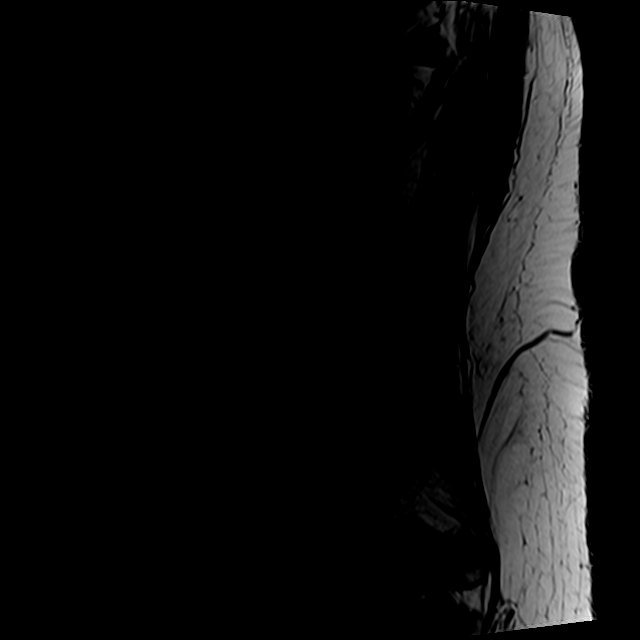

[Series 5: T2 · axial · 4.0mm · 0.78mm/px · z∈[-89,+143]mm · 9 of 38 slices shown (2 of 2)]
[im 1/38]
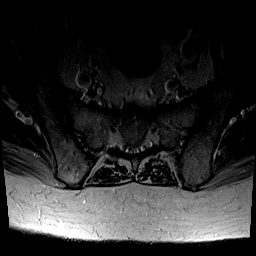
[im 6/38]
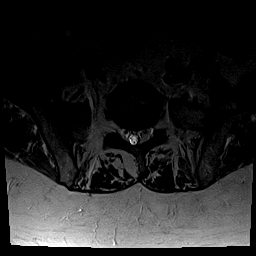
[im 11/38]
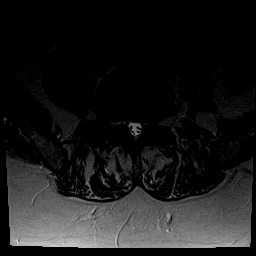
[im 16/38]
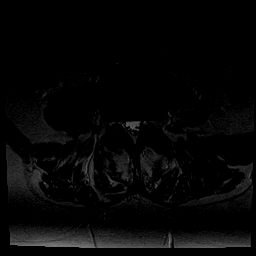
[im 19/38]
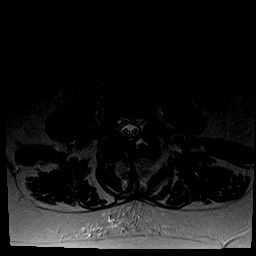
[im 22/38]
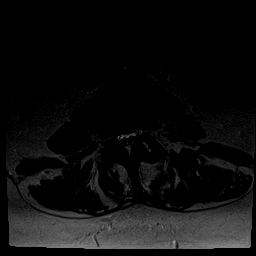
[im 27/38]
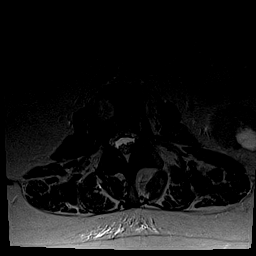
[im 32/38]
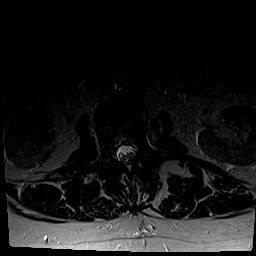
[im 38/38]
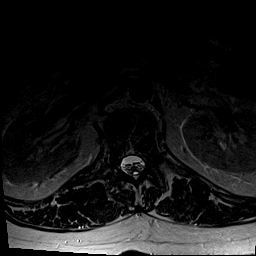

[Series 6: T1 · axial · 4.0mm · 0.31mm/px · z∈[-66,+114]mm · 3 of 38 slices shown (2 of 2)]
[im 6/38]
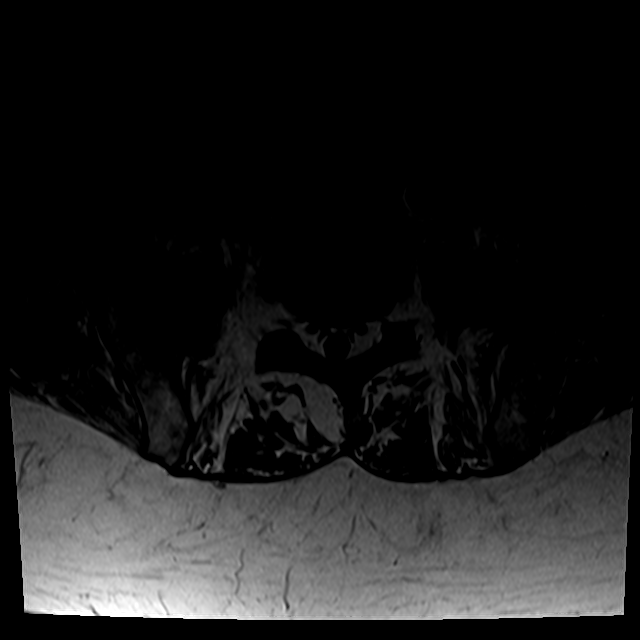
[im 19/38]
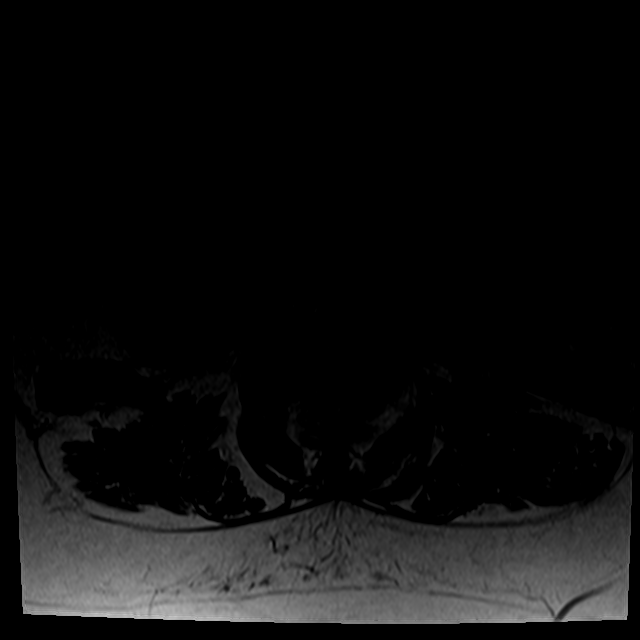
[im 32/38]
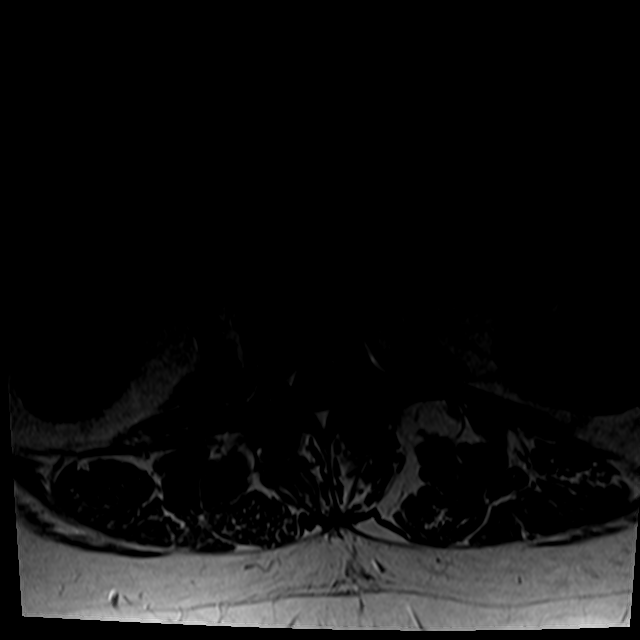

[23 of 48 positions shown; findings below may reference images not displayed]

FINDINGS: Segmentation: Transitional lumbosacral anatomy. For the purposes of
this report a vestigial S1-S2 disc space is designated with and
otherwise fully lumbarized S1 level and the lowest lumbarized level
being L5. Ribs are visible at T12. There might be hypoplastic ribs
at L1, uncertain. Correlation with radiographs is recommended prior
to any operative intervention.

Alignment: Exaggerated lumbar lordosis. Mild dextroconvex lumbar
scoliosis. Subtle anterolisthesis of L5 on S1. Mild retrolisthesis
of L1 on L2 and L2 on L3.

Vertebrae: No marrow edema or evidence of acute osseous abnormality.
Visualized bone marrow signal is within normal limits. Right L3
vertebral body benign hemangioma. Negative visible sacral ala and SI
joints.

Conus medullaris: Extends to the L1 level and appears normal.

Paraspinal and other soft tissues: Negative.

Disc levels:

T11-T12:  Partially visible, negative.

T12-L1:  Negative.

L1-L2: Mild retrolisthesis. Mild disc space loss and circumferential
disc bulge. Mild facet hypertrophy. Borderline to mild left lateral
recess stenosis (descending left L2 nerve root level).

L2-L3: Mild retrolisthesis. Mild circumferential disc bulge with
broad-based posterior component. Mild facet hypertrophy. Mild left
lateral recess stenosis (descending left L3 nerve root level).

L3-L4: Circumferential disc bulge with broad-based slightly right
eccentric posterior component. Moderate to severe facet and mild
ligament flavum hypertrophy. Mild spinal stenosis (series 5, image
17) without convincing lateral recess stenosis. Mild bilateral L3
foraminal stenosis.

L4-L5: Mild circumferential disc bulge with mild broad-based
posterior component. Mild to moderate facet and ligament flavum
hypertrophy. Mild epidural lipomatosis. No significant stenosis.

L5-S1: Subtle grade 1 anterolisthesis. Moderate to severe facet
hypertrophy. Mild mostly far lateral disc bulging and endplate
spurring. Mild epidural lipomatosis. No significant spinal or
convincing lateral recess stenosis. There is up to moderate right L5
foraminal stenosis which is multifactorial (series 2, image 4).

S1-S2:  Transitional, otherwise negative.
IMPRESSION: 1. Mildly transitional anatomy. Correlation with radiographs is
recommended prior to any operative intervention.
2. Exaggerated lumbar lordosis with mild grade 1 anterolisthesis at
L5-S1 and mild upper lumbar retrolisthesis. Generalized lumbar disc
bulging maximal at L3-L4. Multilevel lumbar facet hypertrophy,
moderate to severe at L3-L4 and L5-S1. Mild lower lumbar epidural
lipomatosis.
3. Subsequent: Mild multifactorial spinal stenosis at L3-L4,
Moderate right foraminal stenosis at L5-S1, and up to mild left
lateral recess stenosis at both L1-L2 and L2-L3.

## 2018-04-19 ENCOUNTER — Encounter: Payer: Self-pay | Admitting: Urology

## 2018-04-27 DIAGNOSIS — M545 Low back pain: Secondary | ICD-10-CM | POA: Diagnosis not present

## 2018-04-27 DIAGNOSIS — G8929 Other chronic pain: Secondary | ICD-10-CM | POA: Diagnosis not present

## 2018-05-09 DIAGNOSIS — G8929 Other chronic pain: Secondary | ICD-10-CM | POA: Diagnosis not present

## 2018-05-09 DIAGNOSIS — M545 Low back pain: Secondary | ICD-10-CM | POA: Diagnosis not present

## 2018-05-10 ENCOUNTER — Ambulatory Visit
Admission: RE | Admit: 2018-05-10 | Discharge: 2018-05-10 | Disposition: A | Discharge status: Home / Self Care | Payer: PPO | Source: Ambulatory Visit | Attending: Urology | Admitting: Urology

## 2018-05-10 DIAGNOSIS — N329 Bladder disorder, unspecified: Secondary | ICD-10-CM | POA: Insufficient documentation

## 2018-05-10 DIAGNOSIS — G8929 Other chronic pain: Secondary | ICD-10-CM | POA: Insufficient documentation

## 2018-05-10 DIAGNOSIS — M549 Dorsalgia, unspecified: Secondary | ICD-10-CM | POA: Insufficient documentation

## 2018-05-10 DIAGNOSIS — N281 Cyst of kidney, acquired: Secondary | ICD-10-CM | POA: Insufficient documentation

## 2018-05-10 DIAGNOSIS — N3289 Other specified disorders of bladder: Secondary | ICD-10-CM | POA: Diagnosis not present

## 2018-05-11 ENCOUNTER — Telehealth: Payer: Self-pay | Admitting: Urology

## 2018-05-11 NOTE — Telephone Encounter (Signed)
Left vm to call back    Notes recorded by Abbie Sons, MD on 05/11/2018 at 1:07 PM EST Ultrasound showed no hydronephrosis or renal changes

## 2018-05-14 NOTE — Telephone Encounter (Signed)
Pt LMOM and I called back and read note from [facility].

## 2018-05-16 DIAGNOSIS — M545 Low back pain: Secondary | ICD-10-CM | POA: Diagnosis not present

## 2018-05-16 DIAGNOSIS — G8929 Other chronic pain: Secondary | ICD-10-CM | POA: Diagnosis not present

## 2018-05-23 DIAGNOSIS — E7800 Pure hypercholesterolemia, unspecified: Secondary | ICD-10-CM | POA: Diagnosis not present

## 2018-05-23 DIAGNOSIS — I10 Essential (primary) hypertension: Secondary | ICD-10-CM | POA: Diagnosis not present

## 2018-05-23 DIAGNOSIS — G20 Parkinson's disease: Secondary | ICD-10-CM | POA: Diagnosis not present

## 2018-06-07 ENCOUNTER — Other Ambulatory Visit: Payer: Self-pay

## 2018-06-11 ENCOUNTER — Ambulatory Visit: Payer: Self-pay | Admitting: Urology

## 2018-06-12 DIAGNOSIS — E611 Iron deficiency: Secondary | ICD-10-CM | POA: Diagnosis not present

## 2018-06-20 DIAGNOSIS — L218 Other seborrheic dermatitis: Secondary | ICD-10-CM | POA: Diagnosis not present

## 2018-06-20 DIAGNOSIS — D2262 Melanocytic nevi of left upper limb, including shoulder: Secondary | ICD-10-CM | POA: Diagnosis not present

## 2018-06-20 DIAGNOSIS — D2272 Melanocytic nevi of left lower limb, including hip: Secondary | ICD-10-CM | POA: Diagnosis not present

## 2018-06-20 DIAGNOSIS — D2261 Melanocytic nevi of right upper limb, including shoulder: Secondary | ICD-10-CM | POA: Diagnosis not present

## 2018-06-20 DIAGNOSIS — L308 Other specified dermatitis: Secondary | ICD-10-CM | POA: Diagnosis not present

## 2018-06-20 DIAGNOSIS — L821 Other seborrheic keratosis: Secondary | ICD-10-CM | POA: Diagnosis not present

## 2018-06-20 DIAGNOSIS — D225 Melanocytic nevi of trunk: Secondary | ICD-10-CM | POA: Diagnosis not present

## 2018-06-20 DIAGNOSIS — D2271 Melanocytic nevi of right lower limb, including hip: Secondary | ICD-10-CM | POA: Diagnosis not present

## 2018-06-26 ENCOUNTER — Encounter: Payer: Self-pay | Admitting: *Deleted

## 2018-06-27 ENCOUNTER — Encounter
Admission: RE | Disposition: A | Discharge status: Home / Self Care | Payer: Self-pay | Source: Home / Self Care | Attending: Internal Medicine

## 2018-06-27 ENCOUNTER — Ambulatory Visit: Payer: PPO | Admitting: Anesthesiology

## 2018-06-27 ENCOUNTER — Ambulatory Visit
Admission: RE | Admit: 2018-06-27 | Discharge: 2018-06-27 | Disposition: A | Discharge status: Home / Self Care | Payer: PPO | Attending: Internal Medicine | Admitting: Internal Medicine

## 2018-06-27 DIAGNOSIS — M1990 Unspecified osteoarthritis, unspecified site: Secondary | ICD-10-CM | POA: Diagnosis not present

## 2018-06-27 DIAGNOSIS — K219 Gastro-esophageal reflux disease without esophagitis: Secondary | ICD-10-CM | POA: Diagnosis not present

## 2018-06-27 DIAGNOSIS — K5790 Diverticulosis of intestine, part unspecified, without perforation or abscess without bleeding: Secondary | ICD-10-CM | POA: Diagnosis not present

## 2018-06-27 DIAGNOSIS — K640 First degree hemorrhoids: Secondary | ICD-10-CM | POA: Diagnosis not present

## 2018-06-27 DIAGNOSIS — F419 Anxiety disorder, unspecified: Secondary | ICD-10-CM | POA: Insufficient documentation

## 2018-06-27 DIAGNOSIS — K5730 Diverticulosis of large intestine without perforation or abscess without bleeding: Secondary | ICD-10-CM | POA: Insufficient documentation

## 2018-06-27 DIAGNOSIS — K648 Other hemorrhoids: Secondary | ICD-10-CM | POA: Diagnosis not present

## 2018-06-27 DIAGNOSIS — E7800 Pure hypercholesterolemia, unspecified: Secondary | ICD-10-CM | POA: Diagnosis not present

## 2018-06-27 DIAGNOSIS — F329 Major depressive disorder, single episode, unspecified: Secondary | ICD-10-CM | POA: Diagnosis not present

## 2018-06-27 DIAGNOSIS — Z79899 Other long term (current) drug therapy: Secondary | ICD-10-CM | POA: Insufficient documentation

## 2018-06-27 DIAGNOSIS — Z1211 Encounter for screening for malignant neoplasm of colon: Secondary | ICD-10-CM | POA: Insufficient documentation

## 2018-06-27 DIAGNOSIS — Z7982 Long term (current) use of aspirin: Secondary | ICD-10-CM | POA: Diagnosis not present

## 2018-06-27 DIAGNOSIS — E785 Hyperlipidemia, unspecified: Secondary | ICD-10-CM | POA: Diagnosis not present

## 2018-06-27 DIAGNOSIS — K5909 Other constipation: Secondary | ICD-10-CM | POA: Diagnosis not present

## 2018-06-27 DIAGNOSIS — Z86010 Personal history of colonic polyps: Secondary | ICD-10-CM | POA: Diagnosis not present

## 2018-06-27 DIAGNOSIS — M109 Gout, unspecified: Secondary | ICD-10-CM | POA: Insufficient documentation

## 2018-06-27 DIAGNOSIS — G4730 Sleep apnea, unspecified: Secondary | ICD-10-CM | POA: Diagnosis not present

## 2018-06-27 DIAGNOSIS — K222 Esophageal obstruction: Secondary | ICD-10-CM | POA: Diagnosis not present

## 2018-06-27 DIAGNOSIS — Z87891 Personal history of nicotine dependence: Secondary | ICD-10-CM | POA: Diagnosis not present

## 2018-06-27 DIAGNOSIS — G20 Parkinson's disease: Secondary | ICD-10-CM | POA: Insufficient documentation

## 2018-06-27 DIAGNOSIS — I10 Essential (primary) hypertension: Secondary | ICD-10-CM | POA: Insufficient documentation

## 2018-06-27 DIAGNOSIS — R1310 Dysphagia, unspecified: Secondary | ICD-10-CM | POA: Insufficient documentation

## 2018-06-27 HISTORY — DX: Disorientation, unspecified: R41.0

## 2018-06-27 HISTORY — DX: Gastro-esophageal reflux disease without esophagitis: K21.9

## 2018-06-27 HISTORY — DX: Concussion with loss of consciousness status unknown, initial encounter: S06.0XAA

## 2018-06-27 HISTORY — DX: Malignant (primary) neoplasm, unspecified: C80.1

## 2018-06-27 HISTORY — DX: Parkinson's disease without dyskinesia, without mention of fluctuations: G20.A1

## 2018-06-27 HISTORY — DX: Male erectile dysfunction, unspecified: N52.9

## 2018-06-27 HISTORY — DX: Hyperlipidemia, unspecified: E78.5

## 2018-06-27 HISTORY — PX: COLONOSCOPY WITH PROPOFOL: SHX5780

## 2018-06-27 HISTORY — DX: Unspecified osteoarthritis, unspecified site: M19.90

## 2018-06-27 HISTORY — DX: Sleep apnea, unspecified: G47.30

## 2018-06-27 HISTORY — DX: Gout, unspecified: M10.9

## 2018-06-27 HISTORY — DX: Concussion with loss of consciousness of unspecified duration, initial encounter: S06.0X9A

## 2018-06-27 HISTORY — DX: Parkinson's disease: G20

## 2018-06-27 HISTORY — DX: Allergy, unspecified, initial encounter: T78.40XA

## 2018-06-27 HISTORY — DX: Unspecified injury of head, initial encounter: S09.90XA

## 2018-06-27 HISTORY — PX: ESOPHAGOGASTRODUODENOSCOPY: SHX5428

## 2018-06-27 SURGERY — EGD (ESOPHAGOGASTRODUODENOSCOPY)
Anesthesia: General

## 2018-06-27 MED ORDER — SODIUM CHLORIDE 0.9 % IV SOLN
INTRAVENOUS | Status: DC
  Administered 2018-06-27 (×2): via INTRAVENOUS

## 2018-06-27 MED ORDER — PROPOFOL 10 MG/ML IV BOLUS
INTRAVENOUS | Status: DC | PRN
  Administered 2018-06-27: 50 mg via INTRAVENOUS

## 2018-06-27 MED ORDER — PROPOFOL 500 MG/50ML IV EMUL
INTRAVENOUS | Status: DC | PRN
  Administered 2018-06-27: 80 ug/kg/min via INTRAVENOUS

## 2018-06-27 MED ORDER — PROPOFOL 500 MG/50ML IV EMUL
INTRAVENOUS | Status: AC
  Filled 2018-06-27: qty 50

## 2018-06-27 NOTE — Op Note (Signed)
[facility] Gastroenterology Patient Name: Christian Anderson Procedure Date: 06/27/2018 8:13 AM MRN: [id number] Account #: [id number] Date of Birth: January 20, 1951 Admit Type: Outpatient Age: 68 Room: [facility] ENDO ROOM 3 Gender: Male Note Status: Finalized Procedure:            Colonoscopy Indications:          High risk colon cancer surveillance: Personal history                        of adenoma less than 10 mm in size Providers:            Blessed Cotham K. Alice Reichert MD, MD Referring MD:         Leonie Douglas. Doy Hutching, MD (Referring MD) Medicines:            Propofol per Anesthesia Complications:        No immediate complications. Procedure:            Pre-Anesthesia Assessment:                       - The risks and benefits of the procedure and the                        sedation options and risks were discussed with the                        patient. All questions were answered and informed                        consent was obtained.                       - Patient identification and proposed procedure were                        verified prior to the procedure by the nurse. The                        procedure was verified in the procedure room.                       - ASA Grade Assessment: III - A patient with severe                        systemic disease.                       - After reviewing the risks and benefits, the patient                        was deemed in satisfactory condition to undergo the                        procedure.                       After obtaining informed consent, the colonoscope was                        passed under direct vision. Throughout the procedure,  the patient's blood pressure, pulse, and oxygen                        saturations were monitored continuously. The                        Colonoscope was introduced through the anus and                        advanced to the the cecum, identified by appendiceal                 orifice and ileocecal valve. The colonoscopy was                        performed without difficulty. The patient tolerated the                        procedure well. The quality of the bowel preparation                        was excellent. The ileocecal valve, appendiceal                        orifice, and rectum were photographed. Findings:      The perianal and digital rectal examinations were normal. Pertinent       negatives include normal sphincter tone and no palpable rectal lesions.      Many small-mouthed diverticula were found in the sigmoid colon.      Non-bleeding internal hemorrhoids were found during retroflexion. The       hemorrhoids were Grade I (internal hemorrhoids that do not prolapse).      The exam was otherwise without abnormality. Impression:           - Diverticulosis in the sigmoid colon.                       - Non-bleeding internal hemorrhoids.                       - The examination was otherwise normal.                       - No specimens collected. Recommendation:       - Patient has a contact number available for                        emergencies. The signs and symptoms of potential                        delayed complications were discussed with the patient.                        Return to normal activities tomorrow. Written discharge                        instructions were provided to the patient.                       - Resume previous diet.                       -  Continue present medications.                       - Repeat colonoscopy in 5 years for surveillance.                       - Return to GI office in 1 year.                       - The findings and recommendations were discussed with                        the patient and their spouse. Procedure Code(s):    --- Professional ---                       N8295, Colorectal cancer screening; colonoscopy on                        individual at high risk Diagnosis Code(s):    ---  Professional ---                       K57.30, Diverticulosis of large intestine without                        perforation or abscess without bleeding                       K64.0, First degree hemorrhoids                       Z86.010, Personal history of colonic polyps CPT copyright 2018 American Medical Association. All rights reserved. The codes documented in this report are preliminary and upon coder review may  be revised to meet current compliance requirements. Efrain Sella MD, MD 06/27/2018 8:37:22 AM This report has been signed electronically. Number of Addenda: 0 Note Initiated On: 06/27/2018 8:13 AM Scope Withdrawal Time: 0 hours 6 minutes 31 seconds  Total Procedure Duration: 0 hours 9 minutes 34 seconds       [facility]

## 2018-06-27 NOTE — Anesthesia Postprocedure Evaluation (Signed)
Anesthesia Post Note  Patient: Christian Anderson.  Procedure(Anderson) Performed: ESOPHAGOGASTRODUODENOSCOPY (EGD) (N/A ) COLONOSCOPY WITH PROPOFOL (N/A )  Patient location during evaluation: Endoscopy Anesthesia Type: General Level of consciousness: awake and alert Pain management: pain level controlled Vital Signs Assessment: post-procedure vital signs reviewed and stable Respiratory status: spontaneous breathing, nonlabored ventilation, respiratory function stable and patient connected to nasal cannula oxygen Cardiovascular status: blood pressure returned to baseline and stable Postop Assessment: no apparent nausea or vomiting Anesthetic complications: no     Last Vitals:  Vitals:   06/27/18 0858 06/27/18 0908  BP: 126/63 140/67  Pulse:    Resp:    Temp:    SpO2:      Last Pain:  Vitals:   06/27/18 0908  TempSrc:   PainSc: 0-No pain                 Christian Anderson

## 2018-06-27 NOTE — Anesthesia Preprocedure Evaluation (Signed)
Anesthesia Evaluation  Patient identified by MRN, date of birth, ID band Patient awake    Reviewed: Allergy & Precautions, NPO status , Patient's Chart, lab work & pertinent test results, reviewed documented beta blocker date and time   Airway Mallampati: III  TM Distance: >3 FB     Dental  (+) Chipped   Pulmonary sleep apnea , former smoker,           Cardiovascular hypertension, Pt. on medications      Neuro/Psych PSYCHIATRIC DISORDERS Anxiety Depression Schizophrenia    GI/Hepatic GERD  ,  Endo/Other    Renal/GU      Musculoskeletal  (+) Arthritis ,   Abdominal   Peds  Hematology   Anesthesia Other Findings Parkinson, gout.  Reproductive/Obstetrics                             Anesthesia Physical Anesthesia Plan  ASA: III  Anesthesia Plan: General   Post-op Pain Management:    Induction: Intravenous  PONV Risk Score and Plan:   Airway Management Planned:   Additional Equipment:   Intra-op Plan:   Post-operative Plan:   Informed Consent: I have reviewed the patients History and Physical, chart, labs and discussed the procedure including the risks, benefits and alternatives for the proposed anesthesia with the patient or authorized representative who has indicated his/her understanding and acceptance.       Plan Discussed with: CRNA  Anesthesia Plan Comments:         Anesthesia Quick Evaluation

## 2018-06-27 NOTE — Transfer of Care (Signed)
Immediate Anesthesia Transfer of Care Note  Patient: Christian Anderson.  Procedure(s) Performed: ESOPHAGOGASTRODUODENOSCOPY (EGD) (N/A ) COLONOSCOPY WITH PROPOFOL (N/A )  Patient Location: PACU  Anesthesia Type:General  Level of Consciousness: awake  Airway & Oxygen Therapy: Patient Spontanous Breathing  Post-op Assessment: Report given to RN  Post vital signs: stable  Last Vitals:  Vitals Value Taken Time  BP 90/40 06/27/2018  8:40 AM  Temp 36.4 C 06/27/2018  8:38 AM  Pulse 52 06/27/2018  8:41 AM  Resp 14 06/27/2018  8:41 AM  SpO2 99 % 06/27/2018  8:41 AM  Vitals shown include unvalidated device data.  Last Pain:  Vitals:   06/27/18 0838  TempSrc: Tympanic  PainSc: Asleep         Complications: No apparent anesthesia complications

## 2018-06-27 NOTE — H&P (Signed)
Outpatient short stay form Pre-procedure 06/27/2018 8:06 AM Christian Anderson K. Alice Reichert, M.D.  Primary Physician: Fulton Reek, MD  Reason for visit: GERD, pharyngeal esophageal dysphagia, personal history of colon polyps  History of present illness: 68 year old male with a history of Parkinson's disease complains of rare intermittent episodes of dysphagia to solids.  He has chronic GERD which appears controlled with pantoprazole 40 mg daily.  Last significant episode of dysphagia was over 9 months ago.  He has had no weight loss.  He has a personal history of tubular adenomatous polyps on colonoscopy in 2015.  He denies any abdominal pain.  He has mild chronic constipation attributed to Parkinson's disease.    Current Facility-Administered Medications:  .  0.9 %  sodium chloride infusion, , Intravenous, Continuous, Balfour, Benay Pike, MD, Last Rate: 20 mL/hr at 06/27/18 0805  Medications Prior to Admission  Medication Sig Dispense Refill Last Dose  . allopurinol (ZYLOPRIM) 100 MG tablet TK 1 T PO QD  10 Past Week at Unknown time  . aspirin 325 MG tablet Take 325 mg by mouth daily.   06/26/2018 at Unknown time  . benztropine (COGENTIN) 0.5 MG tablet TK 1 T PO Q NIGHT  9 06/26/2018 at Unknown time  . buPROPion (WELLBUTRIN SR) 150 MG 12 hr tablet Take by mouth.   06/26/2018 at Unknown time  . carbidopa-levodopa (SINEMET IR) 25-250 MG tablet TK 1 T PO QID  3 06/27/2018 at Unknown time  . cyclobenzaprine (FLEXERIL) 10 MG tablet Take 10 mg by mouth daily.     Marland Kitchen donepezil (ARICEPT) 10 MG tablet Take 10 mg by mouth at bedtime.   06/26/2018 at Unknown time  . lisinopril (PRINIVIL,ZESTRIL) 20 MG tablet Take 20 mg by mouth daily.   06/27/2018 at Unknown time  . Melatonin 10 MG TABS Take by mouth.   06/26/2018 at Unknown time  . memantine (NAMENDA) 5 MG tablet Reported on 10/22/2015   06/26/2018 at Unknown time  . Multiple Vitamin (MULTI-VITAMINS) TABS Take by mouth.   Past Week at Unknown time  . nadolol (CORGARD) 80  MG tablet 1 1/2 bid   06/27/2018 at Unknown time  . Omega-3 Fatty Acids (FISH OIL PO) Take by mouth.   Past Week at Unknown time  . pantoprazole (PROTONIX) 40 MG tablet TK 1 T PO QD  0 06/26/2018 at Unknown time  . senna (SENOKOT) 8.6 MG TABS tablet Take by mouth.   06/26/2018 at Unknown time  . sertraline (ZOLOFT) 100 MG tablet Take 1 tablet (100 mg total) by mouth daily. 30 tablet 4 06/26/2018 at Unknown time  . solifenacin (VESICARE) 10 MG tablet Take 1 tablet (10 mg total) by mouth daily. 30 tablet 11 06/26/2018 at Unknown time  . traMADol (ULTRAM) 50 MG tablet    06/26/2018 at Unknown time  . traZODone (DESYREL) 100 MG tablet TK 1 TO 2 TS PO HS   06/26/2018 at Unknown time  . Cyanocobalamin (RA VITAMIN B-12 TR) 1000 MCG TBCR Take by mouth.   Taking  . orphenadrine (NORFLEX) 100 MG tablet 1/2-1 po q HS prn   Taking     No Known Allergies   Past Medical History:  Diagnosis Date  . Allergy   . Anxiety   . Arthritis    osteoarthritis  . Cancer ([place])   . Concussion   . Confusion   . Depression   . Erectile dysfunction   . GERD (gastroesophageal reflux disease)   . Gout   . Head trauma   .  Hyperlipemia   . Hyperlipidemia   . Hypertension   . Parkinson's disease ([place])   . Sleep apnea   . Tremor     Review of systems:  Otherwise negative.    Physical Exam  Gen: Alert, oriented. Appears stated age.  HEENT: [place]/AT. PERRLA. Lungs: CTA, no wheezes. CV: RR nl S1, S2. Abd: soft, benign, no masses. BS+ Ext: No edema. Pulses 2+    Planned procedures: Proceed with EGD and colonoscopy. The patient understands the nature of the planned procedure, indications, risks, alternatives and potential complications including but not limited to bleeding, infection, perforation, damage to internal organs and possible oversedation/side effects from anesthesia. The patient agrees and gives consent to proceed.  Please refer to procedure notes for findings, recommendations and patient  disposition/instructions.     Donel Osowski K. Alice Reichert, M.D. Gastroenterology 06/27/2018  8:06 AM

## 2018-06-27 NOTE — Interval H&P Note (Signed)
History and Physical Interval Note:  06/27/2018 8:08 AM  Christian Anderson.  has presented today for surgery, with the diagnosis of GERD,PERSONAL HX.OF COLON POLYPS  The various methods of treatment have been discussed with the patient and family. After consideration of risks, benefits and other options for treatment, the patient has consented to  Procedure(s): ESOPHAGOGASTRODUODENOSCOPY (EGD) (N/A) COLONOSCOPY WITH PROPOFOL (N/A) as a surgical intervention .  The patient's history has been reviewed, patient examined, no change in status, stable for surgery.  I have reviewed the patient's chart and labs.  Questions were answered to the patient's satisfaction.     [place], [place]

## 2018-06-27 NOTE — Op Note (Signed)
[facility] Gastroenterology Patient Name: Christian Anderson Procedure Date: 06/27/2018 8:14 AM MRN: [id number] Account #: [id number] Date of Birth: [birth year] Admit Type: Outpatient Age: 68 Room: [facility] ENDO ROOM 3 Gender: Male Note Status: Finalized Procedure:            Upper GI endoscopy Indications:          Dysphagia, Follow-up of esophageal reflux Providers:            Benay Pike. Alice Reichert MD, MD Referring MD:         Leonie Douglas. Doy Hutching, MD (Referring MD) Medicines:            Propofol per Anesthesia Complications:        No immediate complications. Procedure:            Pre-Anesthesia Assessment:                       - The risks and benefits of the procedure and the                        sedation options and risks were discussed with the                        patient. All questions were answered and informed                        consent was obtained.                       - Patient identification and proposed procedure were                        verified prior to the procedure by the nurse. The                        procedure was verified in the procedure room.                       - ASA Grade Assessment: III - A patient with severe                        systemic disease.                       - After reviewing the risks and benefits, the patient                        was deemed in satisfactory condition to undergo the                        procedure.                       After obtaining informed consent, the endoscope was                        passed under direct vision. Throughout the procedure,                        the patient's blood pressure, pulse, and oxygen  saturations were monitored continuously. The Endoscope                        was introduced through the mouth, and advanced to the                        third part of duodenum. The upper GI endoscopy was                        accomplished without difficulty. The  patient tolerated                        the procedure well. Findings:      Non-occlusive lower esophageal "B" ring      The entire examined stomach was normal.      The examined duodenum was normal.      The exam was otherwise without abnormality. Impression:           - Normal stomach.                       - Normal examined duodenum.                       - The examination was otherwise normal.                       - No specimens collected. Recommendation:       - Proceed with colonoscopy Procedure Code(s):    --- Professional ---                       [phone], Esophagogastroduodenoscopy, flexible, transoral;                        diagnostic, including collection of specimen(s) by                        brushing or washing, when performed (separate procedure) Diagnosis Code(s):    --- Professional ---                       K21.9, Gastro-esophageal reflux disease without                        esophagitis                       R13.10, Dysphagia, unspecified CPT copyright 2018 [employer]. All rights reserved. The codes documented in this report are preliminary and upon coder review may  be revised to meet current compliance requirements. Efrain Sella MD, MD 06/27/2018 8:23:47 AM This report has been signed electronically. Number of Addenda: 0 Note Initiated On: 06/27/2018 8:14 AM      [facility]

## 2018-06-27 NOTE — Anesthesia Post-op Follow-up Note (Signed)
Anesthesia QCDR form completed.        

## 2018-06-28 ENCOUNTER — Encounter: Payer: Self-pay | Admitting: Internal Medicine

## 2018-07-04 DIAGNOSIS — M5416 Radiculopathy, lumbar region: Secondary | ICD-10-CM | POA: Diagnosis not present

## 2018-07-04 DIAGNOSIS — M5136 Other intervertebral disc degeneration, lumbar region: Secondary | ICD-10-CM | POA: Diagnosis not present

## 2018-08-09 DIAGNOSIS — M25552 Pain in left hip: Secondary | ICD-10-CM | POA: Diagnosis not present

## 2018-08-09 DIAGNOSIS — F329 Major depressive disorder, single episode, unspecified: Secondary | ICD-10-CM | POA: Diagnosis not present

## 2018-08-09 DIAGNOSIS — M1A0710 Idiopathic chronic gout, right ankle and foot, without tophus (tophi): Secondary | ICD-10-CM | POA: Diagnosis not present

## 2018-08-09 DIAGNOSIS — R351 Nocturia: Secondary | ICD-10-CM | POA: Diagnosis not present

## 2018-08-09 DIAGNOSIS — F0281 Dementia in other diseases classified elsewhere with behavioral disturbance: Secondary | ICD-10-CM | POA: Diagnosis not present

## 2018-08-09 DIAGNOSIS — I10 Essential (primary) hypertension: Secondary | ICD-10-CM | POA: Diagnosis not present

## 2018-08-09 DIAGNOSIS — M25551 Pain in right hip: Secondary | ICD-10-CM | POA: Diagnosis not present

## 2018-08-09 DIAGNOSIS — E559 Vitamin D deficiency, unspecified: Secondary | ICD-10-CM | POA: Diagnosis not present

## 2018-08-09 DIAGNOSIS — K219 Gastro-esophageal reflux disease without esophagitis: Secondary | ICD-10-CM | POA: Diagnosis not present

## 2019-04-01 ENCOUNTER — Other Ambulatory Visit: Payer: Self-pay | Admitting: Urology

## 2019-04-01 DIAGNOSIS — N3281 Overactive bladder: Secondary | ICD-10-CM
